# Patient Record
Sex: Male | Born: 1965 | Race: Black or African American | Hispanic: No | Smoking: Never smoker
Health system: Southern US, Community
[De-identification: ages and names within clinical notes are randomized; demographics above are authoritative.]

## PROBLEM LIST (undated history)

## (undated) DIAGNOSIS — N529 Male erectile dysfunction, unspecified: Secondary | ICD-10-CM

## (undated) DIAGNOSIS — T7840XA Allergy, unspecified, initial encounter: Secondary | ICD-10-CM

## (undated) DIAGNOSIS — E119 Type 2 diabetes mellitus without complications: Secondary | ICD-10-CM

## (undated) HISTORY — DX: Male erectile dysfunction, unspecified: N52.9

## (undated) HISTORY — DX: Allergy, unspecified, initial encounter: T78.40XA

## (undated) HISTORY — DX: Type 2 diabetes mellitus without complications: E11.9

---

## 2016-07-09 ENCOUNTER — Encounter (HOSPITAL_COMMUNITY): Payer: Self-pay | Admitting: Emergency Medicine

## 2016-07-09 ENCOUNTER — Inpatient Hospital Stay (HOSPITAL_COMMUNITY)
Admission: EM | Admit: 2016-07-09 | Discharge: 2016-07-11 | DRG: 638 | Disposition: A | Discharge status: Home / Self Care | Payer: BLUE CROSS/BLUE SHIELD | Attending: Internal Medicine | Admitting: Internal Medicine

## 2016-07-09 DIAGNOSIS — E1165 Type 2 diabetes mellitus with hyperglycemia: Principal | ICD-10-CM | POA: Diagnosis present

## 2016-07-09 DIAGNOSIS — R739 Hyperglycemia, unspecified: Secondary | ICD-10-CM | POA: Diagnosis not present

## 2016-07-09 DIAGNOSIS — N179 Acute kidney failure, unspecified: Secondary | ICD-10-CM | POA: Diagnosis present

## 2016-07-09 DIAGNOSIS — E871 Hypo-osmolality and hyponatremia: Secondary | ICD-10-CM | POA: Diagnosis present

## 2016-07-09 DIAGNOSIS — N289 Disorder of kidney and ureter, unspecified: Secondary | ICD-10-CM

## 2016-07-09 DIAGNOSIS — E119 Type 2 diabetes mellitus without complications: Secondary | ICD-10-CM

## 2016-07-09 DIAGNOSIS — E860 Dehydration: Secondary | ICD-10-CM | POA: Diagnosis present

## 2016-07-09 LAB — CBC
HEMATOCRIT: 41.5 % (ref 39.0–52.0)
Hemoglobin: 13.6 g/dL (ref 13.0–17.0)
MCH: 29.4 pg (ref 26.0–34.0)
MCHC: 32.8 g/dL (ref 30.0–36.0)
MCV: 89.8 fL (ref 78.0–100.0)
Platelets: 286 10*3/uL (ref 150–400)
RBC: 4.62 MIL/uL (ref 4.22–5.81)
RDW: 12.8 % (ref 11.5–15.5)
WBC: 6.9 10*3/uL (ref 4.0–10.5)

## 2016-07-09 LAB — URINALYSIS, ROUTINE W REFLEX MICROSCOPIC
BILIRUBIN URINE: NEGATIVE
HGB URINE DIPSTICK: NEGATIVE
KETONES UR: NEGATIVE mg/dL
Leukocytes, UA: NEGATIVE
Nitrite: NEGATIVE
PH: 6.5 (ref 5.0–8.0)
Protein, ur: NEGATIVE mg/dL
Specific Gravity, Urine: 1.03 (ref 1.005–1.030)

## 2016-07-09 LAB — BASIC METABOLIC PANEL
Anion gap: 9 (ref 5–15)
BUN: 15 mg/dL (ref 6–20)
CHLORIDE: 92 mmol/L — AB (ref 101–111)
CO2: 23 mmol/L (ref 22–32)
Calcium: 9.2 mg/dL (ref 8.9–10.3)
Creatinine, Ser: 1.27 mg/dL — ABNORMAL HIGH (ref 0.61–1.24)
GFR calc Af Amer: >60 mL/min (ref >60)
GFR calc non Af Amer: >60 mL/min (ref >60)
GLUCOSE: 866 mg/dL — AB (ref 65–99)
POTASSIUM: 4.3 mmol/L (ref 3.5–5.1)
Sodium: 124 mmol/L — ABNORMAL LOW (ref 135–145)

## 2016-07-09 LAB — URINE MICROSCOPIC-ADD ON: RBC / HPF: NONE SEEN RBC/hpf (ref 0–5)

## 2016-07-09 LAB — CBG MONITORING, ED: Glucose-Capillary: >600 mg/dL (ref 65–99)

## 2016-07-09 MED ORDER — SODIUM CHLORIDE 0.9 % IV BOLUS (SEPSIS)
2000.0000 mL | Freq: Once | INTRAVENOUS | Status: AC
  Administered 2016-07-10: 2000 mL via INTRAVENOUS

## 2016-07-09 NOTE — ED Provider Notes (Signed)
CSN: [id number]     Arrival date & time 07/09/16  2236 History  By signing my name below, I, Rosario Adie, attest that this documentation has been prepared under the direction and in the presence of Melene Plan, DO. Electronically Signed: Rosario Adie, ED Scribe. 07/09/2016. 12:10 AM.   Chief Complaint  Patient presents with  . Hyperglycemia   Patient is a 50 y.o. male presenting with hyperglycemia. The history is provided by the patient. No language interpreter was used.  Hyperglycemia Severity:  Severe Onset quality:  Gradual Timing:  Constant Progression:  Worsening Chronicity:  New Diabetes status:  Non-diabetic Relieved by:  Nothing Ineffective treatments:  None tried Associated symptoms: dizziness, increased thirst and polyuria   Associated symptoms: no abdominal pain, no chest pain, no confusion, no fever, no shortness of breath and no vomiting   Risk factors: no family hx of diabetes    HPI Comments: Jacob Smith. is a 50 y.o. male with no pertinent PMHx, who presents to the Emergency Department complaining of gradual onset, gradually worsening, intermittent episodes of dizziness, polyuria, and polydipsia onset PTA. Associated symptoms include diffuse myalgias. Pts family member reports that they took the pt's blood sugar level at home just PTA and it read "high". Pt does not have a hx of dx'd DM. Pt reports that he has been eating at baseline since the onset of his symptoms, but has been drinking fluids significantly more. He does not have a PCP. Pt denies CP, abdominal pain, SOB, or any other symptoms.  History reviewed. No pertinent past medical history. History reviewed. No pertinent surgical history. No family history on file. Social History  Substance Use Topics  . Smoking status: Never Smoker  . Smokeless tobacco: Not on file  . Alcohol use No    Review of Systems  Constitutional: Negative for fever and chills.  HENT: Negative for congestion and  facial swelling.   Eyes: Negative for discharge and visual disturbance.  Respiratory: Negative for shortness of breath.   Cardiovascular: Negative for chest pain and palpitations.  Gastrointestinal: Negative for vomiting, abdominal pain and diarrhea.  Endocrine: Positive for polydipsia and polyuria. Negative for polyphagia.  Musculoskeletal: Positive for myalgias (diffuse). Negative for arthralgias.  Skin: Negative for color change and rash.  Neurological: Positive for dizziness. Negative for tremors, syncope and headaches.  Psychiatric/Behavioral: Negative for confusion and dysphoric mood.  All other systems reviewed and are negative.  Allergies  Review of patient's allergies indicates no known allergies.  Home Medications   Prior to Admission medications   Not on File   BP 121/87 (BP Location: Right Arm)   Pulse 82   Temp 97.6 F (36.4 C) (Oral)   Resp 26   SpO2 94%    Physical Exam  Constitutional: He is oriented to person, place, and time. He appears well-developed and well-nourished.  HENT:  Head: Normocephalic and atraumatic.  Eyes: EOM are normal. Pupils are equal, round, and reactive to light.  Neck: Normal range of motion. Neck supple. No JVD present.  Cardiovascular: Normal rate and regular rhythm.  Exam reveals no gallop and no friction rub.   No murmur heard. Pulmonary/Chest: No respiratory distress. He has no wheezes.  Abdominal: He exhibits no distension. There is no rebound and no guarding.  Musculoskeletal: Normal range of motion.  Neurological: He is alert and oriented to person, place, and time.  Skin: No rash noted. No pallor.  Psychiatric: He has a normal mood and affect. His behavior is  normal.  Nursing note and vitals reviewed.  ED Course  Procedures (including critical care time)  DIAGNOSTIC STUDIES: Oxygen Saturation is 96% on RA, normal by my interpretation.   COORDINATION OF CARE: 12:09 AM-Discussed next steps with pt. Pt verbalized  understanding and is agreeable with the plan.   Labs Review Labs Reviewed  BASIC METABOLIC PANEL - Abnormal; Notable for the following:       Result Value   Sodium 124 (*)    Chloride 92 (*)    Glucose, Bld 866 (*)    Creatinine, Ser 1.27 (*)    All other components within normal limits  URINALYSIS, ROUTINE W REFLEX MICROSCOPIC (NOT AT [facility]) - Abnormal; Notable for the following:    Glucose, UA >1000 (*)    All other components within normal limits  URINE MICROSCOPIC-ADD ON - Abnormal; Notable for the following:    Squamous Epithelial / LPF 0-5 (*)    Bacteria, UA RARE (*)    All other components within normal limits  CBG MONITORING, ED - Abnormal; Notable for the following:    Glucose-Capillary >600 (*)    All other components within normal limits  CBG MONITORING, ED - Abnormal; Notable for the following:    Glucose-Capillary 599 (*)    All other components within normal limits  CBG MONITORING, ED - Abnormal; Notable for the following:    Glucose-Capillary 448 (*)    All other components within normal limits  CBC   Imaging Review No results found.  I have personally reviewed and evaluated these images and lab results as part of my medical decision-making.   EKG Interpretation None       MDM   Final diagnoses:  Hyperglycemia    50 yo Male with a chief complaint of hyperglycemia. This is noted this morning on a fingerstick at home. Patient has no history of diabetes he had been having increased urine frequency and polydipsia. On my exam patient is well-appearing and nontoxic. Labs are remarkable for a glucose of 860. Patient has no noted acidosis or elevated anion gap. Patient started on insulin drip. Admit to stepdown.  CRITICAL CARE Performed by: Rae Roam   Total critical care time: 35 minutes  Critical care time was exclusive of separately billable procedures and treating other patients.  Critical care was necessary to treat or prevent imminent or  life-threatening deterioration.  Critical care was time spent personally by me on the following activities: development of treatment plan with patient and/or surrogate as well as nursing, discussions with consultants, evaluation of patient's response to treatment, examination of patient, obtaining history from patient or surrogate, ordering and performing treatments and interventions, ordering and review of laboratory studies, ordering and review of radiographic studies, pulse oximetry and re-evaluation of patient's condition.   The patients results and plan were reviewed and discussed.   Any x-rays performed were independently reviewed by myself.   Differential diagnosis were considered with the presenting HPI.  Medications  dextrose 5 %-0.45 % sodium chloride infusion (not administered)  insulin regular (NOVOLIN R,HUMULIN R) 250 Units in sodium chloride 0.9 % 250 mL (1 Units/mL) infusion (not administered)  0.9 %  sodium chloride infusion (not administered)  sodium chloride 0.9 % bolus 2,000 mL (2,000 mLs Intravenous New Bag/Given 07/10/16 0010)    Vitals:   07/09/16 2246 07/10/16 0003 07/10/16 0004 07/10/16 0005  BP: 117/86 121/87 121/87   Pulse: 74  76 82  Resp: 18  26   Temp: 97.6 F (36.4  C)     TempSrc: Oral     SpO2: 96%  95% 94%    Final diagnoses:  Hyperglycemia    Admission/ observation were discussed with the admitting physician, patient and/or family and they are comfortable with the plan.   .I personally performed the services described in this documentation, which was scribed in my presence. The recorded information has been reviewed and is accurate.    Melene Plan, DO 07/10/16 6045

## 2016-07-09 NOTE — ED Notes (Signed)
Per pt, c/o dizzy spells, drinking water excessively, using the bathroom a lot. Pt family member took blood sugar and the reading showed "high". Pt no history of diabetes. Pt ambulatory with steady gait. Pt c/o soreness all over.

## 2016-07-10 ENCOUNTER — Encounter (HOSPITAL_COMMUNITY): Payer: Self-pay | Admitting: Family Medicine

## 2016-07-10 DIAGNOSIS — E119 Type 2 diabetes mellitus without complications: Secondary | ICD-10-CM

## 2016-07-10 DIAGNOSIS — E1165 Type 2 diabetes mellitus with hyperglycemia: Secondary | ICD-10-CM | POA: Diagnosis present

## 2016-07-10 DIAGNOSIS — E871 Hypo-osmolality and hyponatremia: Secondary | ICD-10-CM | POA: Diagnosis not present

## 2016-07-10 DIAGNOSIS — N289 Disorder of kidney and ureter, unspecified: Secondary | ICD-10-CM

## 2016-07-10 DIAGNOSIS — N179 Acute kidney failure, unspecified: Secondary | ICD-10-CM | POA: Diagnosis present

## 2016-07-10 DIAGNOSIS — E860 Dehydration: Secondary | ICD-10-CM | POA: Diagnosis present

## 2016-07-10 DIAGNOSIS — R739 Hyperglycemia, unspecified: Secondary | ICD-10-CM

## 2016-07-10 LAB — GLUCOSE, CAPILLARY
GLUCOSE-CAPILLARY: 148 mg/dL — AB (ref 65–99)
GLUCOSE-CAPILLARY: 141 mg/dL — AB (ref 65–99)
GLUCOSE-CAPILLARY: 158 mg/dL — AB (ref 65–99)
GLUCOSE-CAPILLARY: 173 mg/dL — AB (ref 65–99)
Glucose-Capillary: 328 mg/dL — ABNORMAL HIGH (ref 65–99)
Glucose-Capillary: 186 mg/dL — ABNORMAL HIGH (ref 65–99)
Glucose-Capillary: 145 mg/dL — ABNORMAL HIGH (ref 65–99)
Glucose-Capillary: 161 mg/dL — ABNORMAL HIGH (ref 65–99)
Glucose-Capillary: 159 mg/dL — ABNORMAL HIGH (ref 65–99)
Glucose-Capillary: 176 mg/dL — ABNORMAL HIGH (ref 65–99)
Glucose-Capillary: 293 mg/dL — ABNORMAL HIGH (ref 65–99)

## 2016-07-10 LAB — BASIC METABOLIC PANEL
ANION GAP: 4 — AB (ref 5–15)
Anion gap: 7 (ref 5–15)
Anion gap: 7 (ref 5–15)
BUN: 10 mg/dL (ref 6–20)
BUN: 10 mg/dL (ref 6–20)
BUN: 9 mg/dL (ref 6–20)
CHLORIDE: 104 mmol/L (ref 101–111)
CHLORIDE: 106 mmol/L (ref 101–111)
CHLORIDE: 107 mmol/L (ref 101–111)
CO2: 26 mmol/L (ref 22–32)
CO2: 26 mmol/L (ref 22–32)
CO2: 26 mmol/L (ref 22–32)
CREATININE: 1.04 mg/dL (ref 0.61–1.24)
CREATININE: 1 mg/dL (ref 0.61–1.24)
CREATININE: 0.99 mg/dL (ref 0.61–1.24)
Calcium: 9.1 mg/dL (ref 8.9–10.3)
Calcium: 9.2 mg/dL (ref 8.9–10.3)
Calcium: 8.8 mg/dL — ABNORMAL LOW (ref 8.9–10.3)
GFR calc non Af Amer: >60 mL/min (ref >60)
GFR calc non Af Amer: >60 mL/min (ref >60)
GFR calc non Af Amer: >60 mL/min (ref >60)
Glucose, Bld: 325 mg/dL — ABNORMAL HIGH (ref 65–99)
Glucose, Bld: 153 mg/dL — ABNORMAL HIGH (ref 65–99)
Glucose, Bld: 169 mg/dL — ABNORMAL HIGH (ref 65–99)
Potassium: 3.5 mmol/L (ref 3.5–5.1)
Potassium: 3.8 mmol/L (ref 3.5–5.1)
Potassium: 3.7 mmol/L (ref 3.5–5.1)
Sodium: 137 mmol/L (ref 135–145)
Sodium: 139 mmol/L (ref 135–145)
Sodium: 137 mmol/L (ref 135–145)

## 2016-07-10 LAB — LIPID PANEL
CHOL/HDL RATIO: 7.7 ratio
Cholesterol: 169 mg/dL (ref 0–200)
HDL: 22 mg/dL — AB (ref >40)
LDL CALC: UNDETERMINED mg/dL (ref 0–99)
TRIGLYCERIDES: 528 mg/dL — AB (ref <150)
VLDL: UNDETERMINED mg/dL (ref 0–40)

## 2016-07-10 LAB — CBG MONITORING, ED
GLUCOSE-CAPILLARY: 599 mg/dL — AB (ref 65–99)
Glucose-Capillary: 448 mg/dL — ABNORMAL HIGH (ref 65–99)

## 2016-07-10 LAB — MRSA PCR SCREENING: MRSA by PCR: NEGATIVE

## 2016-07-10 MED ORDER — INSULIN STARTER KIT- SYRINGES (ENGLISH)
1.0000 | Freq: Once | Status: DC
  Filled 2016-07-10: qty 1

## 2016-07-10 MED ORDER — ENOXAPARIN SODIUM 40 MG/0.4ML ~~LOC~~ SOLN
40.0000 mg | SUBCUTANEOUS | Status: DC
  Administered 2016-07-10 – 2016-07-11 (×2): 40 mg via SUBCUTANEOUS
  Filled 2016-07-10 (×2): qty 0.4

## 2016-07-10 MED ORDER — SODIUM CHLORIDE 0.9 % IV SOLN: INTRAVENOUS | Status: DC

## 2016-07-10 MED ORDER — INSULIN GLARGINE 100 UNIT/ML ~~LOC~~ SOLN
18.0000 [IU] | Freq: Every day | SUBCUTANEOUS | Status: DC
  Administered 2016-07-10 – 2016-07-11 (×2): 18 [IU] via SUBCUTANEOUS
  Filled 2016-07-10 (×2): qty 0.18

## 2016-07-10 MED ORDER — POTASSIUM CHLORIDE 10 MEQ/100ML IV SOLN
10.0000 meq | INTRAVENOUS | Status: DC
  Administered 2016-07-10: 10 meq via INTRAVENOUS
  Filled 2016-07-10: qty 100

## 2016-07-10 MED ORDER — LIVING WELL WITH DIABETES BOOK
Freq: Once | Status: DC
  Filled 2016-07-10: qty 1

## 2016-07-10 MED ORDER — INSULIN ASPART 100 UNIT/ML ~~LOC~~ SOLN
0.0000 [IU] | Freq: Three times a day (TID) | SUBCUTANEOUS | Status: DC
  Administered 2016-07-10 – 2016-07-11 (×2): 3 [IU] via SUBCUTANEOUS
  Administered 2016-07-11: 5 [IU] via SUBCUTANEOUS

## 2016-07-10 MED ORDER — SODIUM CHLORIDE 0.9 % IV SOLN
INTRAVENOUS | Status: DC
  Filled 2016-07-10: qty 2.5

## 2016-07-10 MED ORDER — ASPIRIN EC 81 MG PO TBEC: 81.0000 mg | DELAYED_RELEASE_TABLET | Freq: Every day | ORAL | Status: DC

## 2016-07-10 MED ORDER — SODIUM CHLORIDE 0.9 % IV SOLN
INTRAVENOUS | Status: DC
  Administered 2016-07-10: 16:00:00 via INTRAVENOUS
  Administered 2016-07-11: 1000 mL via INTRAVENOUS

## 2016-07-10 MED ORDER — ASPIRIN EC 81 MG PO TBEC
81.0000 mg | DELAYED_RELEASE_TABLET | Freq: Every day | ORAL | Status: DC
  Administered 2016-07-11: 81 mg via ORAL
  Filled 2016-07-10: qty 1

## 2016-07-10 MED ORDER — INSULIN ASPART 100 UNIT/ML ~~LOC~~ SOLN
0.0000 [IU] | Freq: Every day | SUBCUTANEOUS | Status: DC
  Administered 2016-07-10: 3 [IU] via SUBCUTANEOUS

## 2016-07-10 MED ORDER — DEXTROSE-NACL 5-0.45 % IV SOLN
INTRAVENOUS | Status: DC
  Administered 2016-07-10: 09:00:00 via INTRAVENOUS

## 2016-07-10 MED ORDER — DEXTROSE-NACL 5-0.45 % IV SOLN: INTRAVENOUS | Status: DC

## 2016-07-10 NOTE — Progress Notes (Addendum)
Inpatient Diabetes Program Recommendations  AACE/ADA: New Consensus Statement on Inpatient Glycemic Control (2015)  Target Ranges:  Prepandial:   less than 140 mg/dL      Peak postprandial:   less than 180 mg/dL (1-2 hours)      Critically ill patients:  140 - 180 mg/dL   Results for VANN, MENDILLO (MRN [id number]) as of 07/10/2016 15:17  Ref. Range 07/09/2016 22:45 07/10/2016 01:44 07/10/2016 03:29 07/10/2016 06:35 07/10/2016 12:05  Glucose-Capillary Latest Ref Range: 65 - 99 mg/dL >191 (HH) 478 (HH) 295 (H) 328 (H) 145 (H)    Admit with: HONK, New Onset DM   History: None  Current Insulin Orders: IV Insulin Drip (to transition to Lantus and Novolog today per MD orders)    -Will order DM educational materials for this patient.  RN to initiate basic DM survival skills education with patient asap.  -Did not see that patient has PCP nor health insurance.  Will consult Care Management with assistance with this issue.  -Note A1c, GAD antibody, and Insulin antibody levels pending.  -Will have DM Coordinator follow up with patient tomorrow.    MD- Patient may not be able to afford Lantus and Novolog at time of discharge.  Please consider switching patient to 70/30 insulin at some point during hospitalization for affordability at time of d/c.  Patient can purchase Reli-On 70/30 insulin at [facility] for $25 per vial with physician Rx.      --Will follow patient during hospitalization--  Ambrose Finland RN, MSN, CDE Diabetes Coordinator Inpatient Glycemic Control Team Team Pager: [phone] (8a-5p)

## 2016-07-10 NOTE — Progress Notes (Signed)
Patient doing well, good appetite. Vital signs are stable. Insulin infusion completed at 1700. Education literature has been ordered for patient. Up to the bathroom with stand by assist. No questions or concerns at this time.

## 2016-07-10 NOTE — H&P (Signed)
History and Physical    Jacob Smith. MVE:[id number] DOB: July 04, 1966 DOA: 07/09/2016  PCP: No primary care provider on file.   Jacob Smith coming from: Home   Chief Complaint: Polyuria, polydipsia, malaise   HPI: Jacob Schuth. is a 50 y.o. male who describes himself as generally healthy and takes no medications on a regular basis, presenting to the emergency department for evaluation of malaise, polyuria, and polydipsia for approximately one month. Jacob Smith reports pain in his usual state of good health until approximately one month ago when he noted the insidious development of dry mouth, polyuria, and polydipsia. These symptoms continued to worsen over the ensuing interval and became accompanied by a generalized malaise. He denies any fevers, chills, chest pain, palpitations, dyspnea, or cough associated with this. There has been no abdominal pain, nausea, vomiting, or diarrhea. He denies any headaches, loss of coordination, focal numbness or weakness, or vision or hearing change. He endorses a family history of non-insulin-dependent diabetes mellitus in his father, brother, and sister. He denies any history significant medical illness. A family member assessed the Jacob Smith's complaints by checking his CBG, which reportedly read "high."  ED Course: Upon arrival to the ED, Jacob Smith is found to be afebrile, saturating adequately on room air, and with vital signs stable. BMP is notable for sodium 124, chloride 92, serum creatinine 1.27, and serum glucose of 866. Serum bicarbonate is normal, as is anion gap. CBC is unremarkable and urinalysis is notable for greater than 1000 glucose, but no ketones. Jacob Smith was given a 2 L normal saline bolus in the emergency department and has remained hemodynamically stable. He will be placed on insulin infusion for initial glycemic-control and observed in the stepdown unit for ongoing evaluation and management of new onset diabetes mellitus with serum glucose greater  than 800, significant symptomatology, and lack of outpatient follow-up.    Review of Systems:  All other systems reviewed and apart from HPI, are negative.  History reviewed. No pertinent past medical history.  History reviewed. No pertinent surgical history.   reports that he has never smoked. He does not have any smokeless tobacco history on file. He reports that he does not drink alcohol. His drug history is not on file.  No Known Allergies  History reviewed. No pertinent family history.   Prior to Admission medications   Not on File    Physical Exam: Vitals:   07/09/16 2246 07/10/16 0003 07/10/16 0004 07/10/16 0005  BP: 117/86 121/87 121/87   Pulse: 74  76 82  Resp: 18  26   Temp: 97.6 F (36.4 C)     TempSrc: Oral     SpO2: 96%  95% 94%      Constitutional: NAD, calm, comfortable Eyes: PERTLA, lids and conjunctivae normal ENMT: Mucous membranes are dry. Posterior pharynx clear of any exudate or lesions.   Neck: normal, supple, no masses, no thyromegaly Respiratory: clear to auscultation bilaterally, no wheezing, no crackles. Normal respiratory effort.   Cardiovascular: S1 & S2 heard, regular rate and rhythm, no significant murmurs, hyperdynamic precordium. No extremity edema. 2+ pedal pulses. No carotid bruits.   Abdomen: No distension, no tenderness, no masses palpated. Bowel sounds normal.  Musculoskeletal: no clubbing / cyanosis. No joint deformity upper and lower extremities. Normal muscle tone.  Skin: no significant rashes, lesions, ulcers. Warm, dry, well-perfused. Neurologic: CN 2-12 grossly intact. Sensation intact, DTR normal. Strength 5/5 in all 4 limbs.  Psychiatric: Normal judgment and insight. Alert and oriented x 3. Normal mood  and affect.     Labs on Admission: I have personally reviewed following labs and imaging studies  CBC:  Recent Labs Lab 07/09/16 2251  WBC 6.9  HGB 13.6  HCT 41.5  MCV 89.8  PLT 286   Basic Metabolic  Panel:  Recent Labs Lab 07/09/16 2251  NA 124*  K 4.3  CL 92*  CO2 23  GLUCOSE 866*  BUN 15  CREATININE 1.27*  CALCIUM 9.2   GFR: CrCl cannot be calculated (Unknown ideal weight.). Liver Function Tests: No results for input(s): AST, ALT, ALKPHOS, BILITOT, PROT, ALBUMIN in the last 168 hours. No results for input(s): LIPASE, AMYLASE in the last 168 hours. No results for input(s): AMMONIA in the last 168 hours. Coagulation Profile: No results for input(s): INR, PROTIME in the last 168 hours. Cardiac Enzymes: No results for input(s): CKTOTAL, CKMB, CKMBINDEX, TROPONINI in the last 168 hours. BNP (last 3 results) No results for input(s): PROBNP in the last 8760 hours. HbA1C: No results for input(s): HGBA1C in the last 72 hours. CBG:  Recent Labs Lab 07/09/16 2245 07/10/16 0144 07/10/16 0329  GLUCAP >600* 599* 448*   Lipid Profile: No results for input(s): CHOL, HDL, LDLCALC, TRIG, CHOLHDL, LDLDIRECT in the last 72 hours. Thyroid Function Tests: No results for input(s): TSH, T4TOTAL, FREET4, T3FREE, THYROIDAB in the last 72 hours. Anemia Panel: No results for input(s): VITAMINB12, FOLATE, FERRITIN, TIBC, IRON, RETICCTPCT in the last 72 hours. Urine analysis:    Component Value Date/Time   COLORURINE YELLOW 07/09/2016 2305   APPEARANCEUR CLEAR 07/09/2016 2305   LABSPEC 1.030 07/09/2016 2305   PHURINE 6.5 07/09/2016 2305   GLUCOSEU >1000 (A) 07/09/2016 2305   HGBUR NEGATIVE 07/09/2016 2305   BILIRUBINUR NEGATIVE 07/09/2016 2305   KETONESUR NEGATIVE 07/09/2016 2305   PROTEINUR NEGATIVE 07/09/2016 2305   NITRITE NEGATIVE 07/09/2016 2305   LEUKOCYTESUR NEGATIVE 07/09/2016 2305   Sepsis Labs: (procalcitonin:4,lacticidven:4) )No results found for this or any previous visit (from the past 240 hour(s)).   Radiological Exams on Admission: No results found.  EKG: Not performed, will obtain as appropriate   Assessment/Plan  1. New-onset diabetes  mellitus - Likely type II given strong FHx, age, and obesity  - Given degree of hyperglycemia (>800), will start with insulin infusion for initial glycemic-control  - Check antibodies, screen for HLD  - Diabetes educator requested   2. Kidney disease of uncertain chronicity  - SCr 1.27 on admission with no prior for comparison  - Suspect at least some of this to be acute prerenal azotemia in setting of uncontrolled sugars and intravascular volume depletion  - Avoid nephrotoxins; continue fluid-resuscitation  - Repeat chem panel   3. Hyponatremia  - Serum sodium 124 on admission, in setting of dehydration  - Suspect some of this to be factitious and related to the very high blood sugar  - Anticipate normalization with correction of sugars and volume deficit    DVT prophylaxis: sq Lovenox  Code Status: Full  Family Communication: Spouse updated at bedside Disposition Plan: Observe in stepdown   Consults called: None Admission status: Observation    Briscoe Deutscher, MD [facility] Pager [phone]  If 7PM-7AM, please contact night-coverage [email or web address] Password [facility]  07/10/2016, 6:07 AM

## 2016-07-10 NOTE — Progress Notes (Signed)
[place] TEAM 1 - Stepdown/ICU TEAM  Janae Bridgeman.  TKW:[id number] DOB: 10/16/1966 DOA: 07/09/2016 PCP: No primary care provider on file.    Brief Narrative:  50 y.o. male who takes no medications on a regular basis, presented to the emergency department for evaluation of malaise, polyuria, and polydipsia for one month.  He endorsed a family history of non-insulin-dependent diabetes mellitus in his father, brother, and sister.  In the ED BMP was notable for sodium 124, serum creatinine 1.27, and serum glucose of 866. Serum bicarbonate was normal, as was anion gap. Urinalysis was notable for greater than 1000 glucose, but no ketones.   Subjective: Pt is seen for a f/u visit.    Assessment & Plan:  New onset diabetes mellitus - HONK  Renal injury of uncertain chronicity  Hyponatremia + pseudohyponatremia  DVT prophylaxis: Lovenox Code Status: FULL CODE Family Communication: no family present at time of exam  Disposition Plan:   Consultants:  None  Procedures: None  Antimicrobials:  None  Objective: Blood pressure (!) 81/55, pulse 60, temperature 98.2 F (36.8 C), temperature source Oral, resp. rate 15, height 6\' 4"  (1.93 m), weight 105.6 kg (232 lb 12.9 oz), SpO2 98 %.  Intake/Output Summary (Last 24 hours) at 07/10/16 1445 Last data filed at 07/10/16 0700  Gross per 24 hour  Intake                0 ml  Output                1 ml  Net               -1 ml   Filed Weights   07/10/16 0500  Weight: 105.6 kg (232 lb 12.9 oz)    Examination: Pt was seen for a f/u visit.    CBC:  Recent Labs Lab 07/09/16 2251  WBC 6.9  HGB 13.6  HCT 41.5  MCV 89.8  PLT 286   Basic Metabolic Panel:  Recent Labs Lab 07/09/16 2251 07/10/16 0622 07/10/16 1005 07/10/16 1354  NA 124* 137 139 137  K 4.3 3.5 3.8 3.7  CL 92* 104 106 107  CO2 23 26 26 26   GLUCOSE 866* 325* 153* 169*  BUN 15 10 10 9   CREATININE 1.27* 1.04 1.00 0.99  CALCIUM 9.2 9.1 9.2 8.8*    GFR: Estimated Creatinine Clearance: 120.4 mL/min (by C-G formula based on SCr of 0.99 mg/dL).  Liver Function Tests: No results for input(s): AST, ALT, ALKPHOS, BILITOT, PROT, ALBUMIN in the last 168 hours. No results for input(s): LIPASE, AMYLASE in the last 168 hours. No results for input(s): AMMONIA in the last 168 hours.  CBG:  Recent Labs Lab 07/09/16 2245 07/10/16 0144 07/10/16 0329 07/10/16 0635 07/10/16 1205  GLUCAP >600* 599* 448* 328* 145*    Recent Results (from the past 240 hour(s))  MRSA PCR Screening     Status: None   Collection Time: 07/10/16  6:50 AM  Result Value Ref Range Status   MRSA by PCR NEGATIVE NEGATIVE Final    Comment:        The GeneXpert MRSA Assay (FDA approved for NASAL specimens only), is one component of a comprehensive MRSA colonization surveillance program. It is not intended to diagnose MRSA infection nor to guide or monitor treatment for MRSA infections.      Scheduled Meds: . enoxaparin (LOVENOX) injection  40 mg Subcutaneous Q24H   Continuous Infusions: . sodium chloride Stopped (07/10/16 0919)  .  dextrose 5 % and 0.45% NaCl 100 mL/hr at 07/10/16 1300  . insulin (NOVOLIN-R) infusion 2.9 Units/hr (07/10/16 1300)     LOS: 0 days   Time spent: No Charge  Lonia Blood, MD [facility] Office  [phone] Pager - Text Page per Amion as per below:  On-Call/Text Page:      [email or web address]      password [email or web address]  If 7PM-7AM, please contact night-coverage [email or web address] Password [email or web address] 07/10/2016, 2:45 PM

## 2016-07-11 LAB — GLUCOSE, CAPILLARY
GLUCOSE-CAPILLARY: 239 mg/dL — AB (ref 65–99)
Glucose-Capillary: 188 mg/dL — ABNORMAL HIGH (ref 65–99)

## 2016-07-11 LAB — COMPREHENSIVE METABOLIC PANEL
ALBUMIN: 3.4 g/dL — AB (ref 3.5–5.0)
ALT: 61 U/L (ref 17–63)
ANION GAP: 5 (ref 5–15)
AST: 51 U/L — ABNORMAL HIGH (ref 15–41)
Alkaline Phosphatase: 81 U/L (ref 38–126)
BUN: 8 mg/dL (ref 6–20)
CO2: 27 mmol/L (ref 22–32)
Calcium: 8.8 mg/dL — ABNORMAL LOW (ref 8.9–10.3)
Chloride: 106 mmol/L (ref 101–111)
Creatinine, Ser: 1.13 mg/dL (ref 0.61–1.24)
GFR calc Af Amer: >60 mL/min (ref >60)
GFR calc non Af Amer: >60 mL/min (ref >60)
GLUCOSE: 270 mg/dL — AB (ref 65–99)
POTASSIUM: 3.9 mmol/L (ref 3.5–5.1)
SODIUM: 138 mmol/L (ref 135–145)
Total Bilirubin: 0.7 mg/dL (ref 0.3–1.2)
Total Protein: 6.2 g/dL — ABNORMAL LOW (ref 6.5–8.1)

## 2016-07-11 LAB — CBC
HEMATOCRIT: 38.5 % — AB (ref 39.0–52.0)
HEMOGLOBIN: 12.4 g/dL — AB (ref 13.0–17.0)
MCH: 28.4 pg (ref 26.0–34.0)
MCHC: 32.2 g/dL (ref 30.0–36.0)
MCV: 88.1 fL (ref 78.0–100.0)
Platelets: 276 10*3/uL (ref 150–400)
RBC: 4.37 MIL/uL (ref 4.22–5.81)
RDW: 12.9 % (ref 11.5–15.5)
WBC: 8.2 10*3/uL (ref 4.0–10.5)

## 2016-07-11 LAB — GLUTAMIC ACID DECARBOXYLASE AUTO ABS: Glutamic Acid Decarb Ab: <5 U/mL (ref 0.0–5.0)

## 2016-07-11 LAB — HEMOGLOBIN A1C
HEMOGLOBIN A1C: 11.2 % — AB (ref 4.8–5.6)
MEAN PLASMA GLUCOSE: 275 mg/dL

## 2016-07-11 LAB — TSH: TSH: 0.894 u[IU]/mL (ref 0.350–4.500)

## 2016-07-11 MED ORDER — INSULIN GLARGINE 100 UNIT/ML ~~LOC~~ SOLN: 24.0000 [IU] | Freq: Every day | SUBCUTANEOUS | 1 refills | Status: DC

## 2016-07-11 MED ORDER — INSULIN GLARGINE 100 UNIT/ML ~~LOC~~ SOLN: 24.0000 [IU] | Freq: Every day | SUBCUTANEOUS | Status: DC

## 2016-07-11 MED ORDER — BLOOD GLUCOSE METER KIT: PACK | 0 refills | Status: DC

## 2016-07-11 MED ORDER — INSULIN STARTER KIT- SYRINGES (ENGLISH): 1.0000 | Freq: Once | 0 refills | Status: AC

## 2016-07-11 MED ORDER — ASPIRIN 81 MG PO TBEC
81.0000 mg | DELAYED_RELEASE_TABLET | Freq: Every day | ORAL | Status: DC
Start: 2016-07-11 — End: 2023-06-21

## 2016-07-11 NOTE — Discharge Instructions (Signed)
Type 2 Diabetes Mellitus, Adult ° °Type 2 diabetes mellitus, often simply referred to as type 2 diabetes, is a long-lasting (chronic) disease. In type 2 diabetes, the pancreas does not make enough insulin (a hormone), the cells are less responsive to the insulin that is made (insulin resistance), or both. Normally, insulin moves sugars from food into the tissue cells. The tissue cells use the sugars for energy. The lack of insulin or the lack of normal response to insulin causes excess sugars to build up in the blood instead of going into the tissue cells. As a result, high blood sugar (hyperglycemia) develops. The effect of high sugar (glucose) levels can cause many complications.  °Type 2 diabetes was also previously called adult-onset diabetes, but it can occur at any age.    °RISK FACTORS  °A person is predisposed to developing type 2 diabetes if someone in the family has the disease and also has one or more of the following primary risk factors: °· Weight gain, or being overweight or obese. °· An inactive lifestyle. °· A history of consistently eating high-calorie foods. °Maintaining a normal weight and regular physical activity can reduce the chance of developing type 2 diabetes. °SYMPTOMS  °A person with type 2 diabetes may not show symptoms initially. The symptoms of type 2 diabetes appear slowly. The symptoms include: °· Increased thirst (polydipsia). °· Increased urination (polyuria). °· Increased urination during the night (nocturia). °· Sudden or unexplained weight changes. °· Frequent, recurring infections. °· Tiredness (fatigue). °· Weakness. °· Vision changes, such as blurred vision. °· Fruity smell to your breath. °· Abdominal pain. °· Nausea or vomiting. °· Cuts or bruises which are slow to heal. °· Tingling or numbness in the hands or feet. °· An open skin wound (ulcer). °DIAGNOSIS °Type 2 diabetes is frequently not diagnosed until complications of diabetes are present. Type 2 diabetes is diagnosed  when symptoms or complications are present and when blood glucose levels are increased. Your blood glucose level may be checked by one or more of the following blood tests: °· A fasting blood glucose test. You will not be allowed to eat for at least 8 hours before a blood sample is taken. °· A random blood glucose test. Your blood glucose is checked at any time of the day regardless of when you ate. °· A hemoglobin A1c blood glucose test. A hemoglobin A1c test provides information about blood glucose control over the previous 3 months. °· An oral glucose tolerance test (OGTT). Your blood glucose is measured after you have not eaten (fasted) for 2 hours and then after you drink a glucose-containing beverage. °TREATMENT  °· You may need to take insulin or diabetes medicine daily to keep blood glucose levels in the desired range. °· If you use insulin, you may need to adjust the dosage depending on the carbohydrates that you eat with each meal or snack. °· Lifestyle changes are recommended as part of your treatment. These may include: °¨ Following an individualized diet plan developed by a nutritionist or dietitian. °¨ Exercising daily. °Your health care providers will set individualized treatment goals for you based on your age, your medicines, how long you have had diabetes, and any other medical conditions you have. Generally, the goal of treatment is to maintain the following blood glucose levels: °· Before meals (preprandial): 80-130 mg/dL. °· After meals (postprandial): below 180 mg/dL. °· A1c: less than 6.5-7%. °HOME CARE INSTRUCTIONS  °· Have your hemoglobin A1c level checked twice a year. °· Perform daily blood glucose   monitoring as directed by your health care provider. °· Monitor urine ketones when you are ill and as directed by your health care provider. °· Take your diabetes medicine or insulin as directed by your health care provider to maintain your blood glucose levels in the desired range. °¨ Never run  out of diabetes medicine or insulin. It is needed every day. °¨ If you are using insulin, you may need to adjust the amount of insulin given based on your intake of carbohydrates. Carbohydrates can raise blood glucose levels but need to be included in your diet. Carbohydrates provide vitamins, minerals, and fiber which are an essential part of a healthy diet. Carbohydrates are found in fruits, vegetables, whole grains, dairy products, legumes, and foods containing added sugars. °· Eat healthy foods. You should make an appointment to see a registered dietitian to help you create an eating plan that is right for you. °· Lose weight if you are overweight. °· Carry a medical alert card or wear your medical alert jewelry. °· Carry a 15-gram carbohydrate snack with you at all times to treat low blood glucose (hypoglycemia). Some examples of 15-gram carbohydrate snacks include: °¨ Glucose tablets, 3 or 4. °¨ Glucose gel, 15-gram tube. °¨ Raisins, 2 tablespoons (24 grams). °¨ Jelly beans, 6. °¨ Animal crackers, 8. °¨ Regular pop, 4 ounces (120 mL). °¨ Gummy treats, 9. °· Recognize hypoglycemia. Hypoglycemia occurs with blood glucose levels of 70 mg/dL and below. The risk for hypoglycemia increases when fasting or skipping meals, during or after intense exercise, and during sleep. Hypoglycemia symptoms can include: °¨ Tremors or shakes. °¨ Decreased ability to concentrate. °¨ Sweating. °¨ Increased heart rate. °¨ Headache. °¨ Dry mouth. °¨ Hunger. °¨ Irritability. °¨ Anxiety. °¨ Restless sleep. °¨ Altered speech or coordination. °¨ Confusion. °· Treat hypoglycemia promptly. If you are alert and able to safely swallow, follow the 15:15 rule: °¨ Take 15-20 grams of rapid-acting glucose or carbohydrate. Rapid-acting options include glucose gel, glucose tablets, or 4 ounces (120 mL) of fruit juice, regular soda, or low-fat milk. °¨ Check your blood glucose level 15 minutes after taking the glucose. °¨ Take 15-20 grams more of  glucose if the repeat blood glucose level is still 70 mg/dL or below. °¨ Eat a meal or snack within 1 hour once blood glucose levels return to normal. °· Be alert to feeling very thirsty and urinating more frequently than usual, which are early signs of hyperglycemia. An early awareness of hyperglycemia allows for prompt treatment. Treat hyperglycemia as directed by your health care provider. °· Engage in at least 150 minutes of moderate-intensity physical activity a week, spread over at least 3 days of the week or as directed by your health care provider. In addition, you should engage in resistance exercise at least 2 times a week or as directed by your health care provider. Try to spend no more than 90 minutes at one time inactive. °· Adjust your medicine and food intake as needed if you start a new exercise or sport. °· Follow your sick-day plan anytime you are unable to eat or drink as usual. °· Do not use any tobacco products including cigarettes, chewing tobacco, or electronic cigarettes. If you need help quitting, ask your health care provider. °· Limit alcohol intake to no more than 1 drink per day for nonpregnant women and 2 drinks per day for men. You should drink alcohol only when you are also eating food. Talk with your health care provider whether alcohol is   safe for you. Tell your health care provider if you drink alcohol several times a week. °· Keep all follow-up visits as directed by your health care provider. This is important. °· Schedule an eye exam soon after the diagnosis of type 2 diabetes and then annually. °· Perform daily skin and foot care. Examine your skin and feet daily for cuts, bruises, redness, nail problems, bleeding, blisters, or sores. A foot exam by a health care provider should be done annually. °· Brush your teeth and gums at least twice a day and floss at least once a day. Follow up with your dentist regularly. °· Share your diabetes management plan with your workplace or  school. °· Keep your immunizations up to date. It is recommended that you receive a flu (influenza) vaccine every year. It is also recommended that you receive a pneumonia (pneumococcal) vaccine. If you are 65 years of age or older and have never received a pneumonia vaccine, this vaccine may be given as a series of two separate shots. Ask your health care provider which additional vaccines may be recommended. °· Learn to manage stress. °· Obtain ongoing diabetes education and support as needed. °· Participate in or seek rehabilitation as needed to maintain or improve independence and quality of life. Request a physical or occupational therapy referral if you are having foot or hand numbness, or difficulties with grooming, dressing, eating, or physical activity. °SEEK MEDICAL CARE IF:  °· You are unable to eat food or drink fluids for more than 6 hours. °· You have nausea and vomiting for more than 6 hours. °· Your blood glucose level is over 240 mg/dL. °· There is a change in mental status. °· You develop an additional serious illness. °· You have diarrhea for more than 6 hours. °· You have been sick or have had a fever for a couple of days and are not getting better. °· You have pain during any physical activity.   °SEEK IMMEDIATE MEDICAL CARE IF: °· You have difficulty breathing. °· You have moderate to large ketone levels. °  °This information is not intended to replace advice given to you by your health care provider. Make sure you discuss any questions you have with your health care provider. °  °Document Released: 12/05/2005 Document Revised: 08/26/2015 Document Reviewed: 07/03/2012 °Elsevier Interactive Patient Education ©2016 [employer] ° ° °Blood Glucose Monitoring, Adult ° °Monitoring your blood glucose (also know as blood sugar) helps you to manage your diabetes. It also helps you and your health care provider monitor your diabetes and determine how well your treatment plan is working. °WHY SHOULD YOU  MONITOR YOUR BLOOD GLUCOSE? °· It can help you understand how food, exercise, and medicine affect your blood glucose. °· It allows you to know what your blood glucose is at any given moment. You can quickly tell if you are having low blood glucose (hypoglycemia) or high blood glucose (hyperglycemia). °· It can help you and your health care provider know how to adjust your medicines. °· It can help you understand how to manage an illness or adjust medicine for exercise. °WHEN SHOULD YOU TEST? °Your health care provider will help you decide how often you should check your blood glucose. This may depend on the type of diabetes you have, your diabetes control, or the types of medicines you are taking. Be sure to write down all of your blood glucose readings so that this information can be reviewed with your health care provider. See below for examples   of testing times that your health care provider may suggest. °Type 1 Diabetes °· Test at least 2 times per day if your diabetes is well controlled, if you are using an insulin pump, or if you perform multiple daily injections. °· If your diabetes is not well controlled or if you are sick, you may need to test more often. °· It is a good idea to also test: °¨ Before every insulin injection. °¨ Before and after exercise. °¨ Between meals and 2 hours after a meal. °¨ Occasionally between 2:00 a.m. and 3:00 a.m. °Type 2 Diabetes °· If you are taking insulin, test at least 2 times per day. However, it is best to test before every insulin injection. °· If you take medicines by mouth (orally), test 2 times a day. °· If you are on a controlled diet, test once a day. °· If your diabetes is not well controlled or if you are sick, you may need to monitor more often. °HOW TO MONITOR YOUR BLOOD GLUCOSE °Supplies Needed °· Blood glucose meter. °· Test strips for your meter. Each meter has its own strips. You must use the strips that go with your own meter. °· A pricking needle  (lancet). °· A device that holds the lancet (lancing device). °· A journal or log book to write down your results. °Procedure °· Wash your hands with soap and water. Alcohol is not preferred. °· Prick the side of your finger (not the tip) with the lancet. °· Gently milk the finger until a small drop of blood appears. °· Follow the instructions that come with your meter for inserting the test strip, applying blood to the strip, and using your blood glucose meter. °Other Areas to Get Blood for Testing °Some meters allow you to use other areas of your body (other than your finger) to test your blood. These areas are called alternative sites. The most common alternative sites are: °· The forearm. °· The thigh. °· The back area of the lower leg. °· The palm of the hand. °The blood flow in these areas is slower. Therefore, the blood glucose values you get may be delayed, and the numbers are different from what you would get from your fingers. Do not use alternative sites if you think you are having hypoglycemia. Your reading will not be accurate. Always use a finger if you are having hypoglycemia. Also, if you cannot feel your lows (hypoglycemia unawareness), always use your fingers for your blood glucose checks. °ADDITIONAL TIPS FOR GLUCOSE MONITORING °· Do not reuse lancets. °· Always carry your supplies with you. °· All blood glucose meters have a 24-hour "hotline" number to call if you have questions or need help. °· Adjust (calibrate) your blood glucose meter with a control solution after finishing a few boxes of strips. °BLOOD GLUCOSE RECORD KEEPING °It is a good idea to keep a daily record or log of your blood glucose readings. Most glucose meters, if not all, keep your glucose records stored in the meter. Some meters come with the ability to download your records to your home computer. Keeping a record of your blood glucose readings is especially helpful if you are wanting to look for patterns. Make notes to go  along with the blood glucose readings because you might forget what happened at that exact time. Keeping good records helps you and your health care provider to work together to achieve good diabetes management.  °  °This information is not intended to replace advice given to   you by your health care provider. Make sure you discuss any questions you have with your health care provider. °  °Document Released: 12/08/2003 Document Revised: 12/26/2014 Document Reviewed: 04/29/2013 °Elsevier Interactive Patient Education ©2016 [employer] Inc. ° °

## 2016-07-11 NOTE — Plan of Care (Signed)
Problem: Food- and Nutrition-Related Knowledge Deficit (NB-1.1) Goal: Nutrition education Formal process to instruct or train a patient/client in a skill or to impart knowledge to help patients/clients voluntarily manage or modify food choices and eating behavior to maintain or improve health. Outcome: Adequate for Discharge  RD consulted for nutrition education regarding diabetes.   Lab Results  Component Value Date   HGBA1C 11.2 (H) 07/10/2016   Case discussed with RN and DM coordinator. Pt will likely discharge home today on insulin. Confirmed that pt does have insurance and PCP.   Spoke primarily with pt's fiance at bedside (pt watching DM education videos). She is very knowledgeable about DM and has successfully managed her own DM by lifestyle interventions. 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 Education session mainly focused on low calorie beverages available to pt.   RD provided "Carbohydrate Counting for People with Diabetes" handout from the Academy of [employer]etics. Discussed different food groups and their effects on blood sugar, emphasizing carbohydrate-containing foods. Provided list of carbohydrates and recommended serving sizes of common foods.  Discussed importance of controlled and consistent carbohydrate intake throughout the day. Provided examples of ways to balance meals/snacks and encouraged intake of high-fiber, whole grain complex carbohydrates. Teach back method used.  Expect fair to good compliance.  Body mass index is 28.34 kg/m. Pt meets criteria for overweight based on current BMI.  Current diet order is carb modified, patient is consuming approximately 100% of meals at this time. Labs and medications reviewed. No further nutrition interventions warranted at this time. RD contact information provided. If additional nutrition issues arise, please re-consult RD.  Jacob Barcelo A.  Jacob Smith, RD, LDN, CDE Pager: [phone] After hours Pager: [phone]

## 2016-07-11 NOTE — Progress Notes (Signed)
D/c  instructions reviewed with pt and his girlfriend/significant other. All education material for diabetes given to pt; insulin kit from here, Living Well with diabetes, nutrition information from dietician, and other education information printed out with d/c instructions. Pt given copy of instructions and scripts.   Pt had no further questions at this time, will f/u tomorrow with new MD that pt's girlfriend has made an appointment for at her MD's office.  Pt and girlfriend feel confident that he will be able to manage his diabetes with meds, diet and exercise.

## 2016-07-11 NOTE — Progress Notes (Signed)
Pt d/c'd with belongings with significant other, declined a wheelchair. Pt given note for his work as he requested after obtaining from MD.

## 2016-07-11 NOTE — Progress Notes (Signed)
Inpatient Diabetes Program Recommendations  AACE/ADA: New Consensus Statement on Inpatient Glycemic Control (2015)  Target Ranges:  Prepandial:   less than 140 mg/dL      Peak postprandial:   less than 180 mg/dL (1-2 hours)      Critically ill patients:  140 - 180 mg/dL   Lab Results  Component Value Date   GLUCAP 188 (H) 07/11/2016   HGBA1C 11.2 (H) 07/10/2016    Review of Glycemic Control HgbA1C indicates very poor glycemic control in the past 2-3 months. States he has no PCP and will call [employer] to get the names of MD's taking new patients. Gave insulin injection this am. Pt's fiance was previously on insulin prior to losing weight and will be a good support. Discussed glucose monitoring 3-4 times/day and taking logbook to MD office for any needed adjustments. Discussed how diet, exercise and stress affects blood sugars. Continues to view diabetes videos on pt ed channel.  *Pt states he has a "good" [employer] policy and said it should cover prescribed insulin.  Inpatient Diabetes Program Recommendations:    Increase Lantus to 24 units QD Add meal coverage insulin - Novolog 6 units tidwc Will need prescription for meter and supplies. To obtain PCP to manage his diabetes.  RN working with pt on insulin administration. Gave case Music therapist.  Thank you. Ailene Ards, RD, LDN, CDE Inpatient Diabetes Coordinator [phone]

## 2016-07-11 NOTE — Discharge Summary (Signed)
DISCHARGE SUMMARY  Jacob Smith.  MR#: [id number]  DOB:12/23/1965  Date of Admission: 07/09/2016 Date of Discharge: 07/11/2016  Attending Physician:Mariel Lukins T  Patient's PCP:No primary care provider on file.  Consults:  none  Disposition: D/C home   Follow-up Appts: Follow-up Information    Follow up with the doctor you have chosen this week to have your blood sugar log checked. .           Tests Needing Follow-up: -Close monitoring of CBGs is indicated -Consideration should be given to initiating an ACE inhibitor if the patient's renal function remains stable and outpatient follow-up  Discharge Diagnoses: New onset diabetes mellitus type 2 with HONK Acute renal injury-resolved Hyponatremia-resolved  Initial presentation: 50 y.o.malewho takes no medications on a regular basis, presented to the emergency department for evaluation of malaise, polyuria, and polydipsia for one month.  He endorsed a family history of non-insulin-dependent diabetes mellitus in his father, brother, and sister.  In the ED BMP was notable for sodium 124, serum creatinine 1.27, and serum glucose of 866. Serum bicarbonate was normal, as was anion gap. Urinalysis was notable for greater than 1000 glucose, but no ketones.   Hospital Course:  New onset diabetes mellitus - HONK Lantus insulin was initiated once the patient CBGs were brought under control using intravenous insulin drip - the patient was educated on administration of insulin injections as well as CBG checks - he was educated on a diabetic diet and the need to increase his physical activity to lose weight - the patient has multiple family members who are diabetic including his fiance and therefore assures me that he has a well-developed of assistance at home - the patient was highly motivated to be discharged 7/24 and given that his CBGs were significantly improved and he was otherwise clinically stable it was felt this was  reasonable - the patient was offered an additional night in the hospital to assure that he was fully educated on his new diagnosis and that his CBGs remained stable but he declined - the patient was advised that he would need very close follow-up with a ongoing primary care physician and he assured the dictating physician that he had actually arranged follow-up the following day with a new outpatient primary care doctor  Acute renal injury Due to severe dehydration/prerenal state related to severely uncontrolled diabetes - renal function had normalized at time of discharge  Hyponatremia + pseudohyponatremia Due to severely uncontrolled hyperglycemia as well as resultant dehydration - corrected with volume resuscitation with normal sodium at time of discharge    Medication List    TAKE these medications   aspirin 81 MG EC tablet Take 1 tablet (81 mg total) by mouth daily.   blood glucose meter kit and supplies Dispense based on patient and insurance preference. Use up to four times daily as directed. (FOR ICD-9 250.00, 250.01).   insulin glargine 100 UNIT/ML injection Commonly known as:  LANTUS Inject 0.24 mLs (24 Units total) into the skin daily. Start taking on:  07/12/2016   insulin starter kit- syringes Misc 1 kit by Other route once.       Day of Discharge BP 117/89 (BP Location: Right Arm)   Pulse 77   Temp 97.4 F (36.3 C) (Oral)   Resp (!) 23   Ht _0  (1.93 m)   Wt 105.6 kg (232 lb 12.9 oz)   SpO2 100%   BMI 28.34 kg/m   Physical Exam: General: No acute respiratory distress Lungs: Clear  to auscultation bilaterally without wheezes or crackles Cardiovascular: Regular rate and rhythm without murmur gallop or rub normal S1 and S2 Abdomen: Nontender, nondistended, soft, bowel sounds positive, no rebound, no ascites, no appreciable mass Extremities: No significant cyanosis, clubbing, or edema bilateral lower extremities  Basic Metabolic Panel:  Recent Labs Lab  07/09/16 2251 07/10/16 0622 07/10/16 1005 07/10/16 1354 07/11/16 0232  NA 124* 137 139 137 138  K 4.3 3.5 3.8 3.7 3.9  CL 92* 104 106 107 106  CO2 _0 GLUCOSE 866* 325* 153* 169* 270*  BUN _1 CREATININE 1.27* 1.04 1.00 0.99 1.13  CALCIUM 9.2 9.1 9.2 8.8* 8.8*    Liver Function Tests:  Recent Labs Lab 07/11/16 0232  AST 51*  ALT 61  ALKPHOS 81  BILITOT 0.7  PROT 6.2*  ALBUMIN 3.4*    CBC:  Recent Labs Lab 07/09/16 2251 07/11/16 0232  WBC 6.9 8.2  HGB 13.6 12.4*  HCT 41.5 38.5*  MCV 89.8 88.1  PLT 286 276    CBG:  Recent Labs Lab 07/10/16 1606 07/10/16 1709 07/10/16 2037 07/11/16 0758 07/11/16 1211  GLUCAP 176* 173* 293* 188* 239*    Recent Results (from the past 240 hour(s))  MRSA PCR Screening     Status: None   Collection Time: 07/10/16  6:50 AM  Result Value Ref Range Status   MRSA by PCR NEGATIVE NEGATIVE Final    Comment:        The GeneXpert MRSA Assay (FDA approved for NASAL specimens only), is one component of a comprehensive MRSA colonization surveillance program. It is not intended to diagnose MRSA infection nor to guide or monitor treatment for MRSA infections.      Time spent in discharge (includes decision making & examination of pt): <30 minutes  07/11/2016, 5:06 PM   Cherene Altes, MD [facility] Office  [phone] Pager [phone]  On-Call/Text Page:      [email or web address]      password [facility]

## 2016-07-11 NOTE — Progress Notes (Signed)
Discussed, demonstrated and had pt return demo of administration of insulin injection. Had pt read/measure amount of insulin already drawn up in syringe, pt able to do so, will need more practice though. Discussed rotating injection sites.   DM education videos started.  Pt's girlfriend at bedside, she states she will be able to help or give pt his insulin injections, states she use to have to take insulin injections (now on oral med since she lost weight).  Encouraged pt to continue to learn and be able to give self injections. ]  Living Well with Diabetes booklet and insulin kit given to pt--discussed contents briefly.  Videos started.

## 2016-07-11 NOTE — Progress Notes (Signed)
Pt has watched videos # [phone] --pt fell asleep after 503 pt states.  Encouraged pt to plan to watch more when lunch arrived.   Spoke with a DM coordinator, informed pt may go home today, but will need more teaching to be ready for home, outpatient teaching would benefit pt also.

## 2016-07-12 LAB — GLUCOSE, CAPILLARY
Glucose-Capillary: 367 mg/dL — ABNORMAL HIGH (ref 65–99)
Glucose-Capillary: 439 mg/dL — ABNORMAL HIGH (ref 65–99)

## 2016-07-12 LAB — ANTI-ISLET CELL ANTIBODY: PANCREATIC ISLET CELL ANTIBODY: NEGATIVE

## 2016-07-15 LAB — INSULIN ANTIBODIES, BLOOD: Insulin Antibodies, Human: 5.5 uU/mL — ABNORMAL HIGH

## 2016-08-11 ENCOUNTER — Ambulatory Visit: Payer: BLUE CROSS/BLUE SHIELD | Admitting: Dietician

## 2017-04-12 ENCOUNTER — Encounter: Payer: Self-pay | Admitting: Nurse Practitioner

## 2020-09-24 ENCOUNTER — Encounter (HOSPITAL_COMMUNITY): Payer: Self-pay

## 2020-09-24 ENCOUNTER — Emergency Department (HOSPITAL_COMMUNITY): Payer: BC Managed Care – PPO

## 2020-09-24 ENCOUNTER — Emergency Department (HOSPITAL_COMMUNITY)
Admission: EM | Admit: 2020-09-24 | Discharge: 2020-09-24 | Disposition: A | Discharge status: Home / Self Care | Payer: BC Managed Care – PPO | Attending: Emergency Medicine | Admitting: Emergency Medicine

## 2020-09-24 ENCOUNTER — Other Ambulatory Visit: Payer: Self-pay

## 2020-09-24 DIAGNOSIS — N179 Acute kidney failure, unspecified: Secondary | ICD-10-CM | POA: Diagnosis not present

## 2020-09-24 DIAGNOSIS — Z20822 Contact with and (suspected) exposure to covid-19: Secondary | ICD-10-CM | POA: Insufficient documentation

## 2020-09-24 DIAGNOSIS — R739 Hyperglycemia, unspecified: Secondary | ICD-10-CM

## 2020-09-24 DIAGNOSIS — R930 Abnormal findings on diagnostic imaging of skull and head, not elsewhere classified: Secondary | ICD-10-CM | POA: Insufficient documentation

## 2020-09-24 DIAGNOSIS — R5383 Other fatigue: Secondary | ICD-10-CM | POA: Diagnosis present

## 2020-09-24 DIAGNOSIS — E1165 Type 2 diabetes mellitus with hyperglycemia: Secondary | ICD-10-CM | POA: Diagnosis not present

## 2020-09-24 LAB — URINALYSIS, ROUTINE W REFLEX MICROSCOPIC
Bacteria, UA: NONE SEEN
Bilirubin Urine: NEGATIVE
Glucose, UA: >=500 mg/dL — AB
Hgb urine dipstick: NEGATIVE
Ketones, ur: 5 mg/dL — AB
Leukocytes,Ua: NEGATIVE
Nitrite: NEGATIVE
Protein, ur: NEGATIVE mg/dL
Specific Gravity, Urine: 1.026 (ref 1.005–1.030)
pH: 7 (ref 5.0–8.0)

## 2020-09-24 LAB — COMPREHENSIVE METABOLIC PANEL
ALT: 41 U/L (ref 0–44)
AST: 34 U/L (ref 15–41)
Albumin: 4.4 g/dL (ref 3.5–5.0)
Alkaline Phosphatase: 94 U/L (ref 38–126)
Anion gap: 8 (ref 5–15)
BUN: 16 mg/dL (ref 6–20)
CO2: 25 mmol/L (ref 22–32)
Calcium: 9.3 mg/dL (ref 8.9–10.3)
Chloride: 94 mmol/L — ABNORMAL LOW (ref 98–111)
Creatinine, Ser: 1.47 mg/dL — ABNORMAL HIGH (ref 0.61–1.24)
GFR calc non Af Amer: 54 mL/min — ABNORMAL LOW (ref >60)
Glucose, Bld: 823 mg/dL (ref 70–99)
Potassium: 5.6 mmol/L — ABNORMAL HIGH (ref 3.5–5.1)
Sodium: 127 mmol/L — ABNORMAL LOW (ref 135–145)
Total Bilirubin: 0.8 mg/dL (ref 0.3–1.2)
Total Protein: 7.7 g/dL (ref 6.5–8.1)

## 2020-09-24 LAB — CBC WITH DIFFERENTIAL/PLATELET
Abs Immature Granulocytes: 0.02 10*3/uL (ref 0.00–0.07)
Basophils Absolute: 0.1 10*3/uL (ref 0.0–0.1)
Basophils Relative: 1 %
Eosinophils Absolute: 0.1 10*3/uL (ref 0.0–0.5)
Eosinophils Relative: 1 %
HCT: 43.4 % (ref 39.0–52.0)
Hemoglobin: 14.3 g/dL (ref 13.0–17.0)
Immature Granulocytes: 0 %
Lymphocytes Relative: 33 %
Lymphs Abs: 2.4 10*3/uL (ref 0.7–4.0)
MCH: 29.2 pg (ref 26.0–34.0)
MCHC: 32.9 g/dL (ref 30.0–36.0)
MCV: 88.8 fL (ref 80.0–100.0)
Monocytes Absolute: 0.5 10*3/uL (ref 0.1–1.0)
Monocytes Relative: 7 %
Neutro Abs: 4 10*3/uL (ref 1.7–7.7)
Neutrophils Relative %: 58 %
Platelets: 324 10*3/uL (ref 150–400)
RBC: 4.89 MIL/uL (ref 4.22–5.81)
RDW: 12.3 % (ref 11.5–15.5)
WBC: 7.1 10*3/uL (ref 4.0–10.5)
nRBC: 0 % (ref 0.0–0.2)

## 2020-09-24 LAB — BLOOD GAS, VENOUS
Acid-base deficit: 3.5 mmol/L — ABNORMAL HIGH (ref 0.0–2.0)
Bicarbonate: 21.9 mmol/L (ref 20.0–28.0)
O2 Saturation: 45 %
Patient temperature: 98.6
pCO2, Ven: 43.5 mmHg — ABNORMAL LOW (ref 44.0–60.0)
pH, Ven: 7.323 (ref 7.250–7.430)
pO2, Ven: 31.8 mmHg — CL (ref 32.0–45.0)

## 2020-09-24 LAB — RESPIRATORY PANEL BY RT PCR (FLU A&B, COVID)
Influenza A by PCR: NEGATIVE
Influenza B by PCR: NEGATIVE
SARS Coronavirus 2 by RT PCR: NEGATIVE

## 2020-09-24 LAB — CBG MONITORING, ED
Glucose-Capillary: >600 mg/dL (ref 70–99)
Glucose-Capillary: >600 mg/dL (ref 70–99)
Glucose-Capillary: 467 mg/dL — ABNORMAL HIGH (ref 70–99)

## 2020-09-24 MED ORDER — METFORMIN HCL 500 MG PO TABS
500.0000 mg | ORAL_TABLET | Freq: Once | ORAL | Status: AC
  Administered 2020-09-24: 500 mg via ORAL
  Filled 2020-09-24: qty 1

## 2020-09-24 MED ORDER — SODIUM CHLORIDE 0.9 % IV BOLUS
1000.0000 mL | Freq: Once | INTRAVENOUS | Status: AC
  Administered 2020-09-24: 1000 mL via INTRAVENOUS

## 2020-09-24 MED ORDER — INSULIN ASPART 100 UNIT/ML ~~LOC~~ SOLN
6.0000 [IU] | Freq: Once | SUBCUTANEOUS | Status: AC
  Administered 2020-09-24: 6 [IU] via INTRAVENOUS
  Filled 2020-09-24: qty 0.06

## 2020-09-24 MED ORDER — INSULIN ASPART 100 UNIT/ML ~~LOC~~ SOLN
10.0000 [IU] | Freq: Once | SUBCUTANEOUS | Status: AC
  Administered 2020-09-24: 10 [IU] via INTRAVENOUS
  Filled 2020-09-24: qty 0.1

## 2020-09-24 MED ORDER — METFORMIN HCL 500 MG PO TABS: 500.0000 mg | ORAL_TABLET | ORAL | 1 refills | Status: DC

## 2020-09-24 NOTE — Discharge Instructions (Signed)
Your blood sugar was high today. It is important that you take your metformin.   I have provided you a prescription. Follow up with your primary care doctor to get refills.   Return to the Emergency Dept for any vomiting, abdominal pain, chest pain or trouble breathing.

## 2020-09-24 NOTE — ED Provider Notes (Signed)
[facility] DEPT Provider Note   CSN: [id number] Arrival date & time: 09/24/20  1546     History Chief Complaint  Patient presents with  . Fatigue  . Headache    Jacob Smith. is a 54 y.o. male past history of diabetes who presents for evaluation of fatigue that has been ongoing for about a week and a half.  He states that initially, he thought it was because of his Covid vaccine which he received about a week and a half ago but states it is persisted.  He feels like he has no energy and has felt like he is going to pass out at times.  He states he never actually passed out.  He states he has not any nausea/vomiting.  He has a history of diabetes and states he takes medication.  He also reports that he has been having intermittent headaches for the last few months.  He states that these will occur randomly or not tied to any specific action.  He states that he will get a headache and states that it will last for few minutes and then dissipate by itself.  He states he has also had some intermittent blurry vision with this.  He has not sought evaluation for this.  He states that he has not had any chest pain or difficulty breathing.  He has not had any cough, nausea/vomiting, abdominal pain. He denies any fevers, vision changes, numbness/weakness of his arms or legs.  The history is provided by the patient.       History reviewed. No pertinent past medical history.  Patient Active Problem List   Diagnosis Date Noted  . Diabetes mellitus, new onset ([place]) 07/10/2016  . Newly diagnosed diabetes ([place]) 07/10/2016    History reviewed. No pertinent surgical history.     History reviewed. No pertinent family history.  Social History   Tobacco Use  . Smoking status: Never Smoker  Substance Use Topics  . Alcohol use: No  . Drug use: Not on file    Home Medications Prior to Admission medications   Medication Sig Start Date End Date Taking?  Authorizing Provider  aspirin EC 81 MG EC tablet Take 1 tablet (81 mg total) by mouth daily. Patient not taking: Reported on 09/24/2020 07/11/16   Cherene Altes, MD  blood glucose meter kit and supplies Dispense based on patient and insurance preference. Use up to four times daily as directed. (FOR ICD-9 250.00, 250.01). 07/11/16   Cherene Altes, MD  insulin glargine (LANTUS) 100 UNIT/ML injection Inject 0.24 mLs (24 Units total) into the skin daily. Patient not taking: Reported on 09/24/2020 07/12/16   Cherene Altes, MD  metFORMIN (GLUCOPHAGE) 500 MG tablet Take 1 tablet (500 mg total) by mouth See admin instructions. One tablet in the morning and 2 tablets in the evening 09/24/20 10/24/20  Volanda Napoleon, PA-C    Allergies    Patient has no known allergies.  Review of Systems   Review of Systems  Constitutional: Positive for fatigue. Negative for fever.  Eyes: Positive for visual disturbance (intermittent).  Respiratory: Negative for cough and shortness of breath.   Cardiovascular: Negative for chest pain.  Gastrointestinal: Negative for abdominal pain, nausea and vomiting.  Neurological: Positive for headaches (intermittent).  All other systems reviewed and are negative.   Physical Exam Updated Vital Signs BP 126/83   Pulse 63   Temp 98.1 F (36.7 C) (Oral)   Resp 16   SpO2  98%   Physical Exam Vitals and nursing note reviewed.  Constitutional:      Appearance: Normal appearance. He is well-developed.  HENT:     Head: Normocephalic and atraumatic.  Eyes:     General: Lids are normal.     Conjunctiva/sclera: Conjunctivae normal.     Pupils: Pupils are equal, round, and reactive to light.     Comments: PERRL. EOMs intact. No nystagmus. No neglect.   Neck:     Vascular: No carotid bruit.     Comments: No carotid bruit Cardiovascular:     Rate and Rhythm: Normal rate and regular rhythm.     Pulses: Normal pulses.     Heart sounds: Normal heart sounds. No  murmur heard.  No friction rub. No gallop.   Pulmonary:     Effort: Pulmonary effort is normal.     Breath sounds: Normal breath sounds.     Comments: Lungs clear to auscultation bilaterally.  Symmetric chest rise.  No wheezing, rales, rhonchi. Abdominal:     Palpations: Abdomen is soft. Abdomen is not rigid.     Tenderness: There is no abdominal tenderness. There is no guarding.  Musculoskeletal:        General: Normal range of motion.     Cervical back: Full passive range of motion without pain.  Skin:    General: Skin is warm and dry.     Capillary Refill: Capillary refill takes less than 2 seconds.  Neurological:     Mental Status: He is alert and oriented to person, place, and time.     Comments: Cranial nerves III-XII intact Follows commands, Moves all extremities  5/5 strength to BUE and BLE  Sensation intact throughout all major nerve distributions No pronator drift. No gait abnormalities  No slurred speech. No facial droop.   Psychiatric:        Speech: Speech normal.     ED Results / Procedures / Treatments   Labs (all labs ordered are listed, but only abnormal results are displayed) Labs Reviewed  COMPREHENSIVE METABOLIC PANEL - Abnormal; Notable for the following components:      Result Value   Sodium 127 (*)    Potassium 5.6 (*)    Chloride 94 (*)    Glucose, Bld 823 (*)    Creatinine, Ser 1.47 (*)    GFR calc non Af Amer 54 (*)    All other components within normal limits  URINALYSIS, ROUTINE W REFLEX MICROSCOPIC - Abnormal; Notable for the following components:   Color, Urine COLORLESS (*)    Glucose, UA >=500 (*)    Ketones, ur 5 (*)    All other components within normal limits  BLOOD GAS, VENOUS - Abnormal; Notable for the following components:   pCO2, Ven 43.5 (*)    pO2, Ven 31.8 (*)    Acid-base deficit 3.5 (*)    All other components within normal limits  CBG MONITORING, ED - Abnormal; Notable for the following components:   Glucose-Capillary  >600 (*)    All other components within normal limits  CBG MONITORING, ED - Abnormal; Notable for the following components:   Glucose-Capillary >600 (*)    All other components within normal limits  CBG MONITORING, ED - Abnormal; Notable for the following components:   Glucose-Capillary 467 (*)    All other components within normal limits  RESPIRATORY PANEL BY RT PCR (FLU A&B, COVID)  CBC WITH DIFFERENTIAL/PLATELET    EKG EKG Interpretation  Date/Time:  Thursday September 24 2020 19:24:27 EDT Ventricular Rate:  65 PR Interval:    QRS Duration: 93 QT Interval:  394 QTC Calculation: 410 R Axis:   69 Text Interpretation: Sinus rhythm No previous tracing Confirmed by Blanchie Dessert [id number]) on 09/24/2020 7:38:08 PM   Radiology CT Head Wo Contrast  Result Date: 09/24/2020 CLINICAL DATA:  Headache EXAM: CT HEAD WITHOUT CONTRAST TECHNIQUE: Contiguous axial images were obtained from the base of the skull through the vertex without intravenous contrast. COMPARISON:  None. FINDINGS: Brain: Normal anatomic configuration. No abnormal intra or extra-axial mass lesion or fluid collection. No abnormal mass effect or midline shift. No evidence of acute intracranial hemorrhage or infarct. Ventricular size is normal. Cerebellum unremarkable. Vascular: Unremarkable Skull: Intact Sinuses/Orbits: Paranasal sinuses are clear. Orbits are unremarkable. Other: Mastoid air cells and middle ear cavities are clear. IMPRESSION: Normal head CT Electronically Signed   By: Fidela Salisbury MD   On: 09/24/2020 18:06    Procedures Procedures (including critical care time)  Medications Ordered in ED Medications  sodium chloride 0.9 % bolus 1,000 mL (0 mLs Intravenous Stopped 09/24/20 1922)  insulin aspart (novoLOG) injection 6 Units (6 Units Intravenous Given 09/24/20 1954)  insulin aspart (novoLOG) injection 10 Units (10 Units Intravenous Given 09/24/20 2210)  metFORMIN (GLUCOPHAGE) tablet 500 mg (500 mg Oral Given  09/24/20 2327)    ED Course  I have reviewed the triage vital signs and the nursing notes.  Pertinent labs & imaging results that were available during my care of the patient were reviewed by me and considered in my medical decision making (see chart for details).    MDM Rules/Calculators/A&P                          54 year old male who presents for evaluation of fatigue x1.5 weeks.  Also reports some intermittent headache, blurry vision has been ongoing for the last several months.  He has not sought evaluation for this.  On initial arrival, he is afebrile, nontoxic-appearing.  Vital signs are stable.  On exam, no neuro deficits.  History/physical exam is not concerning for intracranial hemorrhage, CVA.  His symptoms been ongoing for several months and do not suspect acute he CVA is a source of his symptoms.  His headaches are intermittently occurring and randomly occur without any specific action.  He states that sometimes he gets blurry vision with them but this eventually resolves.  He does state that he has a history of eye issues.  He also reports prolonged fatigue that has been ongoing.  He does state that this initially started after getting his Covid vaccine but will plan to check labs given his history of diabetes.  We will plan to check labs, head CT.   CMP shows sodium of 127, potassium of 5.6, glucose of 823, BUN of 16, creatinine of 1.47.  Anion gap is 8.  His corrected sodium is 139.  CBC without any significant leukocytosis or anemia. UA shows small ketones. No infections. VBG shows pH of 7.323. Bicarb is 21.9. PO2 is 31.8.  His baseline creatinine is slightly up.  He is potassium is 5.6 but will give insulin for his sugar which should adjust the potassium. Anion gap is within normal limits.    I discussed blood sugar results with patient.  He tells me he has been out of his Metformin for about a month.  He is also had some increased thirst.  I suspect this is where his fatigue  is  coming from.  At this time, his work-up does not look concerning for DKA.  He will be given fluids, insulin and his blood sugar will be rechecked.  CT head without any acute abnormalities.  CBG is greater than 600. This was prior to getting insulin. We'll plan to give additional insulin have an additional reassessment of his blood sugar.  Patient's repeat CBG is 467.   Discussed results with patient.  He is resting comfortably on bed with no signs of distress.  He states he feels comfortable going home.  At this time, no neuro deficits.  He is hemodynamically stable and is not in DKA.  We will plan to give him a refill of his Metformin.  Patient does have a primary care doctor.  He states he has not seen her in several months.  I discussed with him that he needs to follow-up with her and get a refill of medication. At this time, patient exhibits no emergent life-threatening condition that require further evaluation in ED. Patient had ample opportunity for questions and discussion. All patient's questions were answered with full understanding. Strict return precautions discussed. Patient expresses understanding and agreement to plan.   Portions of this note were generated with Lobbyist. Dictation errors may occur despite best attempts at proofreading.  Final Clinical Impression(s) / ED Diagnoses Final diagnoses:  Hyperglycemia  AKI (acute kidney injury) ([place])    Rx / DC Orders ED Discharge Orders         Ordered    metFORMIN (GLUCOPHAGE) 500 MG tablet  See admin instructions        09/24/20 2318           Volanda Napoleon, PA-C 09/25/20 1501    Blanchie Dessert, MD 09/28/20 [phone]

## 2020-09-24 NOTE — ED Triage Notes (Signed)
Pt presents with c/o fatigue and headache. Pt reports he hasn't felt good since he received his covid vaccine 1.5 weeks ago.

## 2020-09-24 NOTE — ED Notes (Signed)
CRITICAL VALUE STICKER  CRITICAL VALUE: Glucose 823  RECEIVER (on-site recipient of call): CSimpson, RN   DATE & TIME NOTIFIED: 09/24/20 1833  MESSENGER (representative from lab):LBillingsley  MD NOTIFIED: PA Mardella Layman  TIME OF NOTIFICATION: 1835  RESPONSE: see orders

## 2021-04-22 IMAGING — CT CT HEAD W/O CM
3 series · 16 of 47 positions shown, 19 images · non-contrast
Comparison: None.

CLINICAL DATA: Headache

EXAM:
CT HEAD WITHOUT CONTRAST
TECHNIQUE: Contiguous axial images were obtained from the base of the skull
through the vertex without intravenous contrast.

[Series 3: head wo · axial · 0.45mm/px · z∈[-203,-73]mm · 10 of 32 slices shown, 13 images]
[im 3/32  brain]
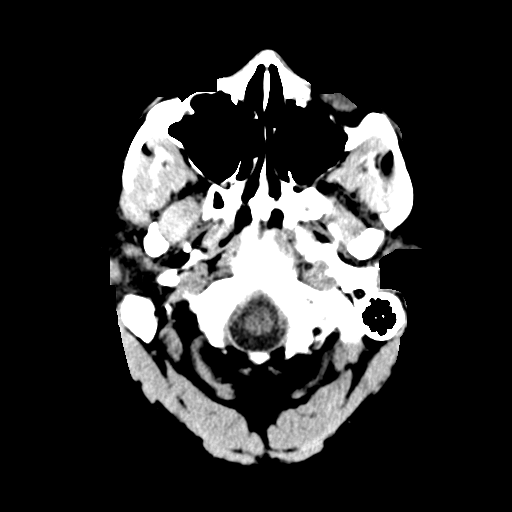
[im 3/32  bone]
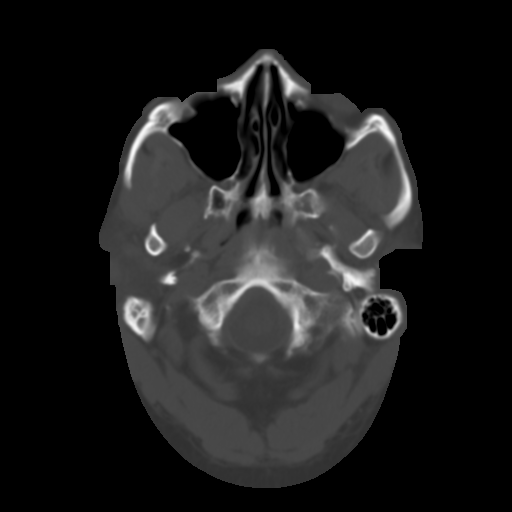
[im 6/32  brain]
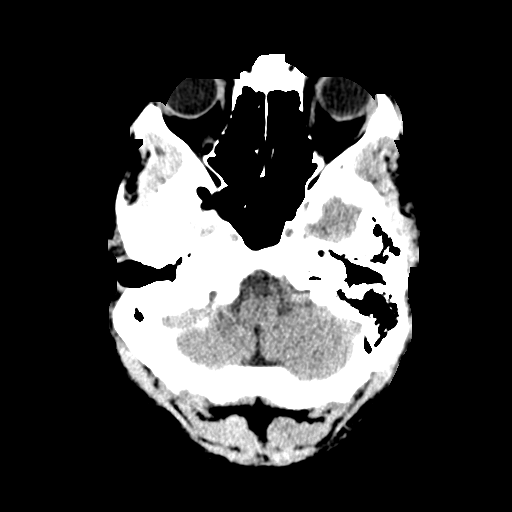
[im 9/32  brain]
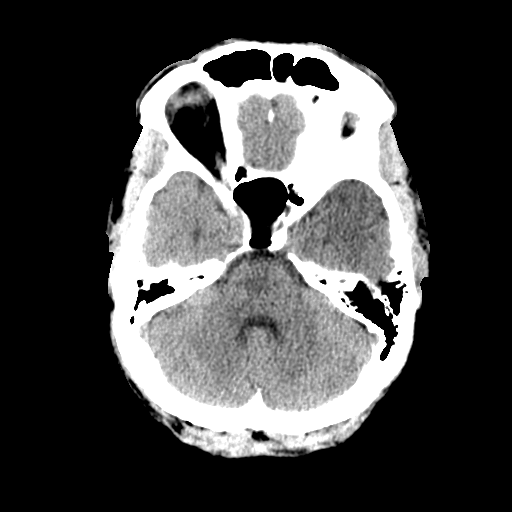
[im 11/32  brain]
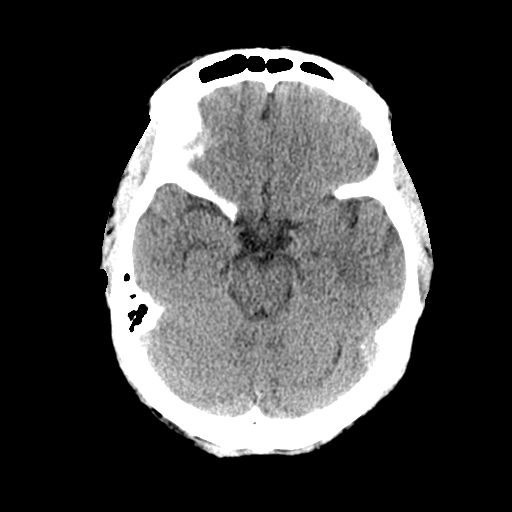
[im 14/32  brain]
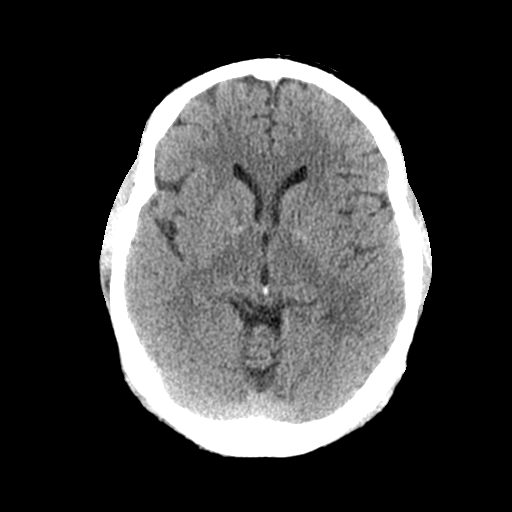
[im 14/32  bone]
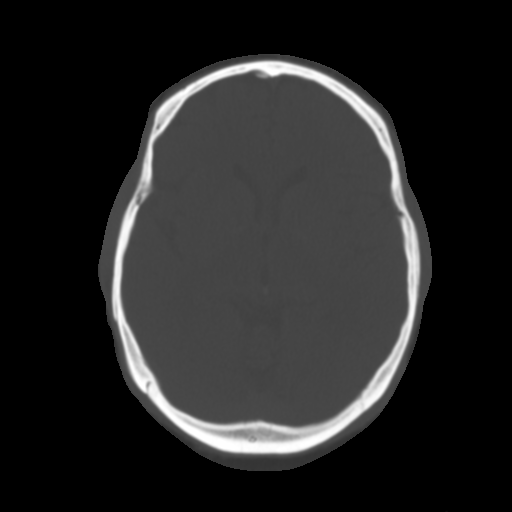
[im 18/32  brain]
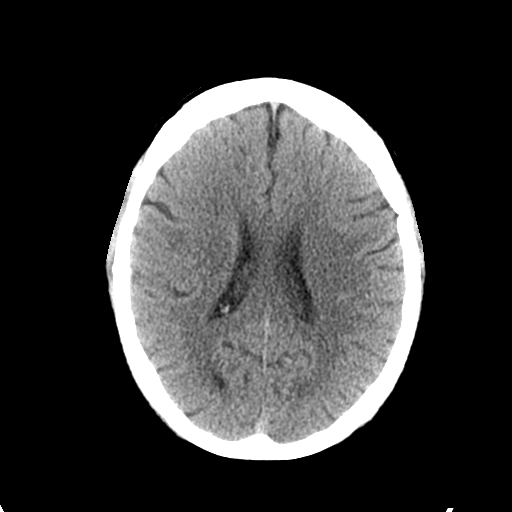
[im 21/32  brain]
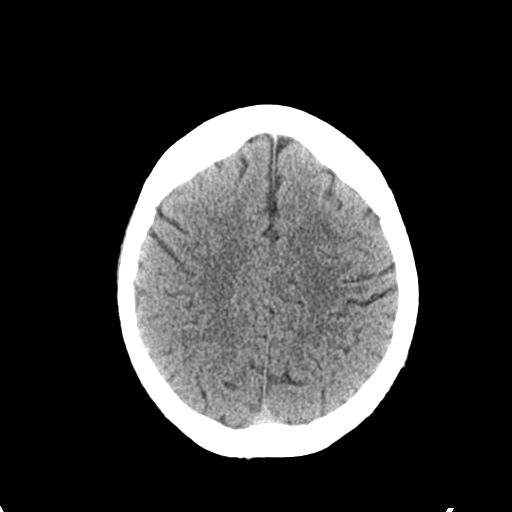
[im 24/32  brain]
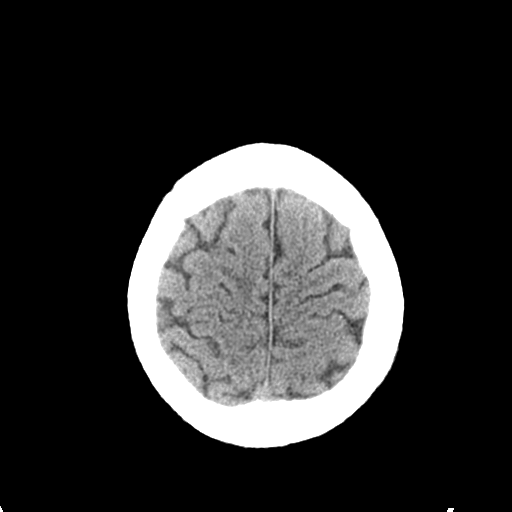
[im 26/32  brain]
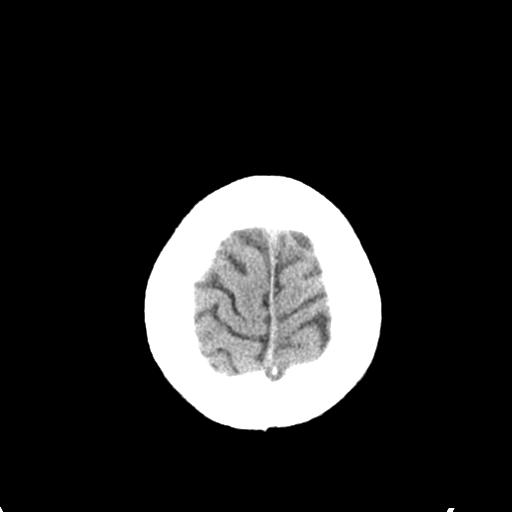
[im 26/32  bone]
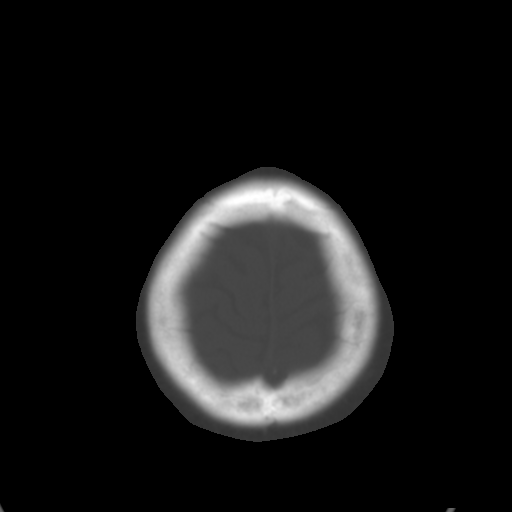
[im 29/32  brain]
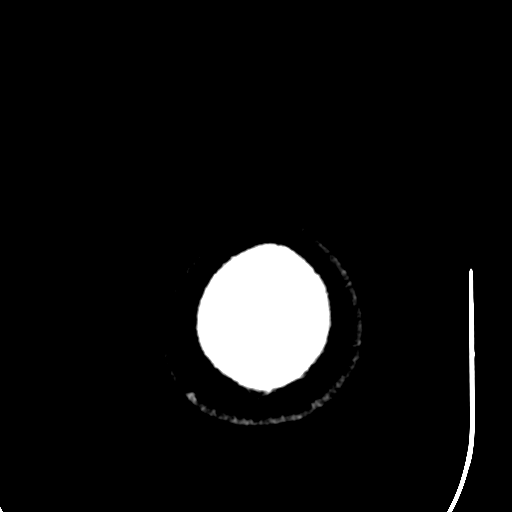

[Series 5: coronal soft tissue · coronal · 0.31mm/px · 3 of 67 slices shown]
[im 23/67  brain]
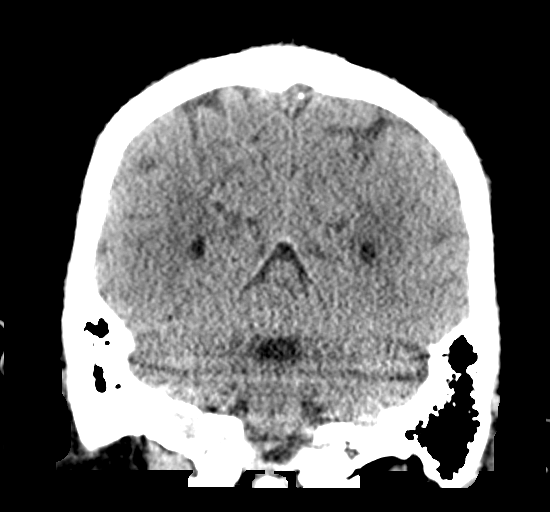
[im 30/67  brain]
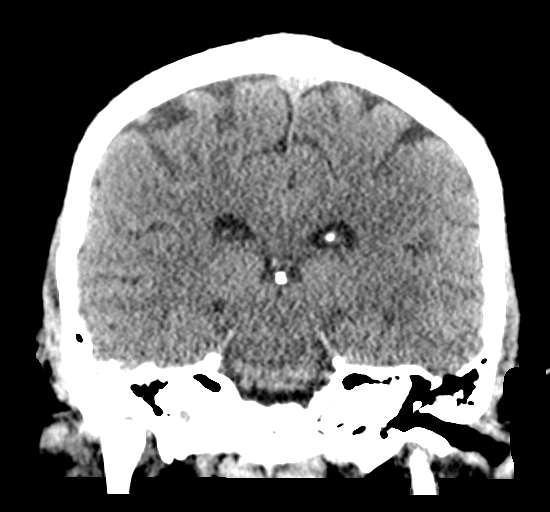
[im 37/67  brain]
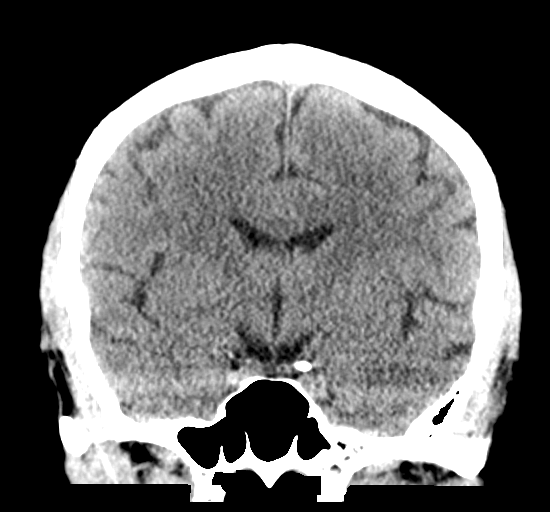

[Series 6: sagittal soft tissue · sagittal · 0.31mm/px · 3 of 58 slices shown]
[im 20/58  brain]
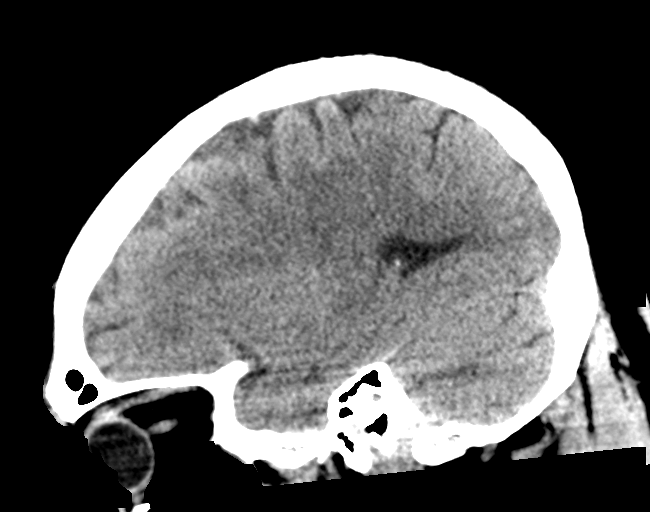
[im 29/58  brain]
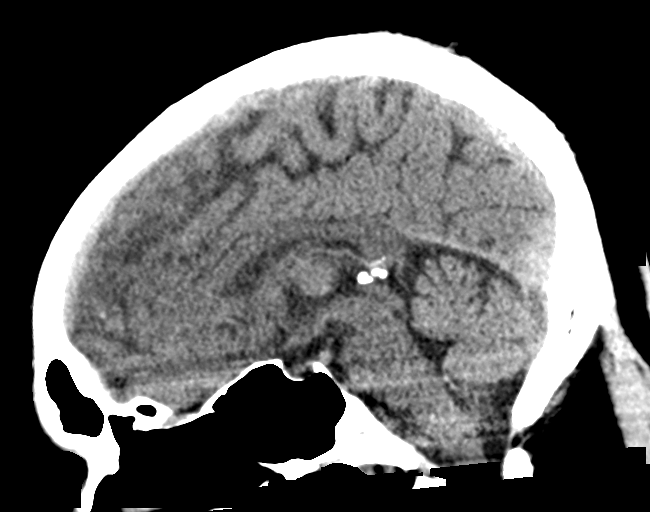
[im 39/58  brain]
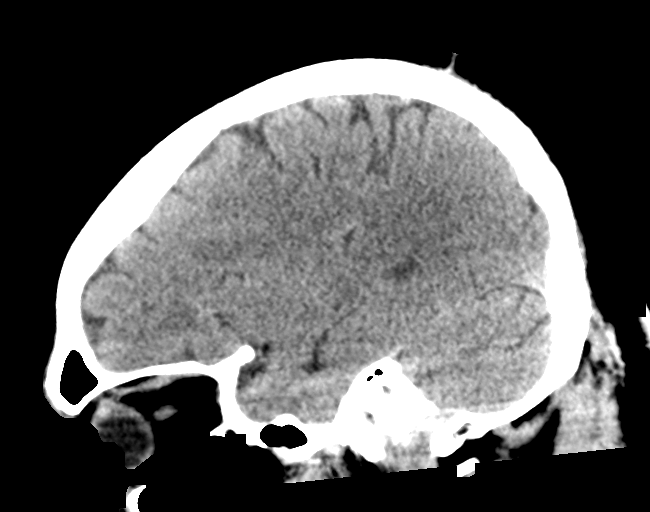

[16 of 47 positions shown; findings below may reference images not displayed]

FINDINGS: Brain: Normal anatomic configuration. No abnormal intra or
extra-axial mass lesion or fluid collection. No abnormal mass effect
or midline shift. No evidence of acute intracranial hemorrhage or
infarct. Ventricular size is normal. Cerebellum unremarkable.

Vascular: Unremarkable

Skull: Intact

Sinuses/Orbits: Paranasal sinuses are clear. Orbits are
unremarkable.

Other: Mastoid air cells and middle ear cavities are clear.
IMPRESSION: Normal head CT

## 2023-06-21 ENCOUNTER — Ambulatory Visit (INDEPENDENT_AMBULATORY_CARE_PROVIDER_SITE_OTHER): Payer: Commercial Managed Care - PPO | Admitting: Internal Medicine

## 2023-06-21 ENCOUNTER — Encounter: Payer: Self-pay | Admitting: Internal Medicine

## 2023-06-21 ENCOUNTER — Telehealth: Payer: Self-pay | Admitting: Internal Medicine

## 2023-06-21 VITALS — BP 102/84 | HR 97 | Temp 98.3°F | Ht 76.0 in | Wt 190.6 lb

## 2023-06-21 DIAGNOSIS — E1165 Type 2 diabetes mellitus with hyperglycemia: Secondary | ICD-10-CM

## 2023-06-21 DIAGNOSIS — Z9189 Other specified personal risk factors, not elsewhere classified: Secondary | ICD-10-CM | POA: Diagnosis not present

## 2023-06-21 DIAGNOSIS — Z794 Long term (current) use of insulin: Secondary | ICD-10-CM

## 2023-06-21 DIAGNOSIS — E0865 Diabetes mellitus due to underlying condition with hyperglycemia: Secondary | ICD-10-CM

## 2023-06-21 DIAGNOSIS — J029 Acute pharyngitis, unspecified: Secondary | ICD-10-CM

## 2023-06-21 DIAGNOSIS — N529 Male erectile dysfunction, unspecified: Secondary | ICD-10-CM | POA: Diagnosis not present

## 2023-06-21 DIAGNOSIS — E119 Type 2 diabetes mellitus without complications: Secondary | ICD-10-CM

## 2023-06-21 DIAGNOSIS — Z1211 Encounter for screening for malignant neoplasm of colon: Secondary | ICD-10-CM

## 2023-06-21 LAB — LDL CHOLESTEROL, DIRECT: Direct LDL: 133 mg/dL

## 2023-06-21 LAB — CBC WITH DIFFERENTIAL/PLATELET
Basophils Absolute: 0 10*3/uL (ref 0.0–0.1)
Basophils Relative: 0.5 % (ref 0.0–3.0)
Eosinophils Absolute: 0.1 10*3/uL (ref 0.0–0.7)
Eosinophils Relative: 1 % (ref 0.0–5.0)
HCT: 43.7 % (ref 39.0–52.0)
Hemoglobin: 14.4 g/dL (ref 13.0–17.0)
Lymphocytes Relative: 40.1 % (ref 12.0–46.0)
Lymphs Abs: 2.9 10*3/uL (ref 0.7–4.0)
MCHC: 33 g/dL (ref 30.0–36.0)
MCV: 87.3 fl (ref 78.0–100.0)
Monocytes Absolute: 0.5 10*3/uL (ref 0.1–1.0)
Monocytes Relative: 6.7 % (ref 3.0–12.0)
Neutro Abs: 3.8 10*3/uL (ref 1.4–7.7)
Neutrophils Relative %: 51.7 % (ref 43.0–77.0)
Platelets: 336 10*3/uL (ref 150.0–400.0)
RBC: 5.01 Mil/uL (ref 4.22–5.81)
RDW: 12.8 % (ref 11.5–15.5)
WBC: 7.3 10*3/uL (ref 4.0–10.5)

## 2023-06-21 LAB — MICROALBUMIN / CREATININE URINE RATIO
Creatinine,U: 59.2 mg/dL
Microalb Creat Ratio: 2 mg/g (ref 0.0–30.0)
Microalb, Ur: 1.2 mg/dL (ref 0.0–1.9)

## 2023-06-21 LAB — LIPID PANEL
Cholesterol: 245 mg/dL — ABNORMAL HIGH (ref 0–200)
HDL: 38.6 mg/dL — ABNORMAL LOW (ref >39.00)
NonHDL: 206.43
Total CHOL/HDL Ratio: 6
Triglycerides: 253 mg/dL — ABNORMAL HIGH (ref 0.0–149.0)
VLDL: 50.6 mg/dL — ABNORMAL HIGH (ref 0.0–40.0)

## 2023-06-21 LAB — COMPREHENSIVE METABOLIC PANEL
ALT: 18 U/L (ref 0–53)
AST: 15 U/L (ref 0–37)
Albumin: 4.6 g/dL (ref 3.5–5.2)
Alkaline Phosphatase: 82 U/L (ref 39–117)
BUN: 13 mg/dL (ref 6–23)
CO2: 30 mEq/L (ref 19–32)
Calcium: 10.4 mg/dL (ref 8.4–10.5)
Chloride: 99 mEq/L (ref 96–112)
Creatinine, Ser: 1.11 mg/dL (ref 0.40–1.50)
GFR: 74.28 mL/min (ref >60.00)
Glucose, Bld: 419 mg/dL — ABNORMAL HIGH (ref 70–99)
Potassium: 4.7 mEq/L (ref 3.5–5.1)
Sodium: 137 mEq/L (ref 135–145)
Total Bilirubin: 0.4 mg/dL (ref 0.2–1.2)
Total Protein: 7.5 g/dL (ref 6.0–8.3)

## 2023-06-21 LAB — HEMOGLOBIN A1C: Hgb A1c MFr Bld: 16.8 % — ABNORMAL HIGH (ref 4.6–6.5)

## 2023-06-21 MED ORDER — BLOOD GLUCOSE TEST VI STRP: 1.0000 | ORAL_STRIP | Freq: Three times a day (TID) | 0 refills | Status: DC

## 2023-06-21 MED ORDER — ROSUVASTATIN CALCIUM 40 MG PO TABS: 40.0000 mg | ORAL_TABLET | Freq: Every day | ORAL | 3 refills | Status: AC

## 2023-06-21 MED ORDER — BLOOD GLUCOSE MONITORING SUPPL DEVI: 1.0000 | Freq: Three times a day (TID) | 0 refills | Status: AC

## 2023-06-21 MED ORDER — SILDENAFIL CITRATE 100 MG PO TABS: 100.0000 mg | ORAL_TABLET | ORAL | 11 refills | Status: AC | PRN

## 2023-06-21 MED ORDER — LANCETS MISC. MISC: 1.0000 | Freq: Three times a day (TID) | 0 refills | Status: AC

## 2023-06-21 MED ORDER — LANCET DEVICE MISC: 1.0000 | Freq: Three times a day (TID) | 0 refills | Status: AC

## 2023-06-21 MED ORDER — TOUJEO SOLOSTAR 300 UNIT/ML ~~LOC~~ SOPN: 24.0000 [IU] | PEN_INJECTOR | Freq: Every day | SUBCUTANEOUS | 3 refills | Status: DC

## 2023-06-21 MED ORDER — ASPIRIN 81 MG PO TBEC: 81.0000 mg | DELAYED_RELEASE_TABLET | Freq: Every day | ORAL | 12 refills | Status: AC

## 2023-06-21 MED ORDER — INSULIN ASPART (W/NIACINAMIDE) 100 UNIT/ML ~~LOC~~ SOPN: 5.0000 [IU] | PEN_INJECTOR | Freq: Three times a day (TID) | SUBCUTANEOUS | 1 refills | Status: DC

## 2023-06-21 NOTE — Telephone Encounter (Signed)
Pt states is medication  insulin glargine, 1 Unit Dial, (TOUJEO SOLOSTAR) 300 UNIT/ML Solostar Pen  Was too expensive, Dr Jon Billings was going to see other options. He states it is around $80. Please advise.

## 2023-06-21 NOTE — Assessment & Plan Note (Signed)
Erectile Dysfunction: He has been using Sildenafil (Viagra) for erectile dysfunction and will continue to use Sildenafil as needed.

## 2023-06-21 NOTE — Assessment & Plan Note (Signed)
High Risk for Cardiovascular Disease: With a family history of heart disease and long term uncontrolled diabetes, he is at high risk for cardiovascular disease. A referral to a cardiologist for further evaluation and management will be made, and starting a cholesterol medication and aspirin for cardiovascular protection will be arranged, and we will get baseline lab results.

## 2023-06-21 NOTE — Patient Instructions (Addendum)
VISIT SUMMARY:  During our visit, we discussed your concerns about your type 2 diabetes and your desire to improve its management. We also talked about your family history of diabetes and heart disease, and your use of Sildenafil for erectile dysfunction. We discussed your recent acquisition of insurance and your willingness to explore alternative treatments for your diabetes.  YOUR PLAN:  -TYPE 2 DIABETES: We will change your insulin regimen to Toujeo and Fiasp, which should help better control your blood sugar levels. We will also provide you with a new glucose monitor to regularly check your blood sugar. We will order a full diabetes lab panel to assess your current control and kidney function. We will also refer you to an eye doctor for a diabetic eye exam. After reviewing your lab results, we may consider adding a cholesterol medication and aspirin to protect your heart.  -HIGH RISK FOR CARDIOVASCULAR DISEASE: Given your family history and uncontrolled diabetes, you are at high risk for heart disease. We will refer you to a heart specialist for further evaluation and management. We may also start you on a cholesterol medication and aspirin for heart protection after we review your lab results.  -ERECTILE DYSFUNCTION: You can continue to use Sildenafil as needed for your erectile dysfunction.  -GENERAL HEALTH MAINTENANCE: We will order a Cologuard test for colon cancer screening. It's important to regularly check for other potential health issues, even as we focus on managing your diabetes.  INSTRUCTIONS:  Please start using the Toujeo and Fiasp insulin pens as directed. Regularly monitor your blood sugar levels with the new glucose monitor. Please also make appointments with the eye doctor and heart specialist we referred you to. We will schedule a follow-up appointment in 1-2 weeks to review your lab results and adjust your treatment plan as necessary.   Welcome aboard!   Today's visit was  a valuable first step in understanding your health and starting your personalized care journey. We discussed your medical history and medications in detail. Given the extensive information, we prioritized addressing your most pressing concerns.  We understood those concerns to be:  New Patient (Initial Visit)   Building a Complete Picture  To create the most effective care plan possible, we may need additional information from previous providers. We encouraged you to gather any relevant medical records for your next visit. This will help [place] build a more complete picture and develop a personalized plan together. In the meantime, we'll address your immediate concerns and provide resources to help you manage all of your medical issues.  We encourage you to use MyChart to review these efforts, and to help [place] find and correct any omissions or errors in your medical chart.  Managing Your Health Over Time  Managing every aspect of your health in a single visit isn't always feasible, but that's okay.  We addressed your most pressing concerns today and charted a course for future care. Acute conditions or preventive care measures may require further attention.  We encourage you to schedule a follow-up visit at your earliest convenience to discuss any unresolved issues.  We strongly encourage participation in annual preventive care visits to help [place] develop a more thorough understanding of your health and to help you maintain optimal wellness - please inquire about scheduling your next one with [place] at your earliest convenience.  Your Satisfaction Matters  It was a pleasure seeing you today!  Your health and satisfaction will always be my top priorities. If you believe your experience  today was worthy of a 5-star rating, I'd be grateful for your feedback!  Lula Olszewski, MD   Next Steps  Schedule Follow-Up:  We recommend a follow-up appointment in No follow-ups on file. If your condition worsens before  then, please call us or seek emergency care. Preventive Care:  Don't forget to schedule your annual preventive care visit!  This important checkup is typically covered by insurance and helps identify potential health issues early.  Typically its 100% insurance covered with no co-pay and helps to get surveillance labwork paid for through your insurance provider.  Sometimes it even lowers your insurance premiums to participate. Medical Information Release:  For any relevant medical information we don't have, please sign a release form so we can obtain it for your records. Lab & X-ray Appointments:  Scheduled any incomplete lab tests today or call us to schedule.  X-Rays can be done without an appointment at [facility] ([address], [address]), M-F 8:30am-noon or 1pm-5pm.  Just tell them you're there for X-rays ordered by Dr. Jon Billings.  We'll receive the results and contact you by phone or MyChart to discuss next steps.  Bring to Your Next Appointment  Medications: Please bring all your medication bottles to your next appointment to ensure we have an accurate record of your prescriptions. Health Diaries: If you're monitoring any health conditions at home, keeping a diary of your readings can be very helpful for discussions at your next appointment.  Reviewing Your Records  Please Review this early draft of your clinical notes below and the final encounter summary tomorrow on MyChart after its been completed.   Diabetes mellitus, new onset (HCC) -     Cologuard -     CBC with Differential/Platelet -     Comprehensive metabolic panel -     Lipid panel -     Hemoglobin A1c -     Microalbumin / creatinine urine ratio -     Insulin and C-Peptide -     Blood Glucose Monitoring Suppl; 1 each by Does not apply route in the morning, at noon, and at bedtime. May substitute to any manufacturer covered by patient's insurance.  Dispense: 1 each; Refill: 0 -     Blood Glucose Test; 1 each  by In Vitro route in the morning, at noon, and at bedtime. May substitute to any manufacturer covered by patient's insurance.  Dispense: 100 strip; Refill: 0 -     Lancet Device; 1 each by Does not apply route in the morning, at noon, and at bedtime. May substitute to any manufacturer covered by patient's insurance.  Dispense: 1 each; Refill: 0 -     Lancets Misc.; 1 each by Does not apply route in the morning, at noon, and at bedtime. May substitute to any manufacturer covered by patient's insurance.  Dispense: 100 each; Refill: 0 -     Toujeo SoloStar; Inject 24 Units into the skin daily. Replaces lantus.  Dispense: 90 mL; Refill: 3 -     Insulin Aspart (w/Niacinamide); Inject 5 Units into the skin 3 (three) times daily before meals.  Dispense: 9 mL; Refill: 1 -     Ambulatory referral to Ophthalmology -     Rosuvastatin Calcium; Take 1 tablet (40 mg total) by mouth daily.  Dispense: 90 tablet; Refill: 3  Screen for colon cancer -     Cologuard  Sore throat  At high risk for cardiovascular disease -     Ambulatory  referral to Cardiology -     Rosuvastatin Calcium; Take 1 tablet (40 mg total) by mouth daily.  Dispense: 90 tablet; Refill: 3 -     Aspirin; Take 1 tablet (81 mg total) by mouth daily. Swallow whole.  Dispense: 30 tablet; Refill: 12  Erectile dysfunction, unspecified erectile dysfunction type -     Sildenafil Citrate; Take 1 tablet (100 mg total) by mouth as needed for erectile dysfunction.  Dispense: 10 tablet; Refill: 11  Diabetes mellitus due to underlying condition, uncontrolled, with hyperglycemia (HCC)     Getting Answers and Following Up  Simple Questions & Concerns: For quick questions or basic follow-up after your visit, reach [place] at [phone] or MyChart messaging. Complex Concerns: If your concern is more complex, scheduling an appointment might be best. Discuss this with the staff to find the most suitable option. Lab & Imaging Results: We'll contact you  directly if results are abnormal or you don't use MyChart. Most normal results will be on MyChart within 2-3 business days, with a review message from Dr. Jon Billings. Haven't heard back in 2 weeks? Need results sooner? Contact us at [phone]. Referrals: Our referral coordinator will manage specialist referrals. The specialist's office should contact you within 2 weeks to schedule an appointment. Call us if you haven't heard from them after 2 weeks.  Staying Connected  MyChart: Activate your MyChart for the fastest way to access results and message [place]. See the last page of this paperwork for instructions.  Billing  X-ray & Lab Orders: These are billed by separate companies. Contact the invoicing company directly for questions or concerns. Visit Charges: Discuss any billing inquiries with our administrative services team.  Feedback & Satisfaction  Share Your Experience: We strive for your satisfaction! If you have any complaints, please let Dr. Jon Billings know directly or contact our Practice Administrators, Edwena Felty or [employer], by asking at the front desk.  Scheduling Tips  Shorter Wait Times: 8 am and 1 pm appointments often have the quickest wait times. Longer Appointments: If you need more time during your visit, talk to the front desk. Due to insurance regulations, multiple back-to-back appointments might be necessary.

## 2023-06-21 NOTE — Assessment & Plan Note (Signed)
Type 2 Diabetes Mellitus: Uncontrolled with a history of kidney damage, he has been inconsistently using Lantus insulin due to financial difficulties and has a family history of diabetes with complications. We will order a full diabetes lab panel to assess current control and kidney function, start Toujeo insulin pen 24 units daily as a basal dose to replace Lantus, and initiate Fiasp insulin pen 5 units TID before meals as a bolus dose. Regular blood glucose monitoring will be ensured with a new glucose monitor, and he will be referred to an ophthalmologist for a diabetic eye exam. After reviewing lab results, we will consider adding a cholesterol medication and aspirin for cardiovascular protection.  Not sure if type 2, will get insulin and C-peptide to confirm, and start with just insulin to be safe. Offered continuous glucose monitor but he doesn't like them.Jacob Smith

## 2023-06-21 NOTE — Progress Notes (Signed)
[facility]  Phone: [phone]  New patient visit  Visit Date: 06/21/2023 Patient: Jacob Smith.   DOB: 05-Jan-1966   57 y.o. Male  MRN: [id number] Patient Care Team: Lula Olszewski, MD as PCP - General (Internal Medicine) Today's Health Care Provider: Lula Olszewski, MD   Assessment and Plan:    Jarvon was seen today for new patient (initial visit).  Diabetes mellitus, new onset (HCC) -     CBC with Differential/Platelet -     Comprehensive metabolic panel -     Lipid panel -     Hemoglobin A1c -     Microalbumin / creatinine urine ratio -     Insulin and C-Peptide -     Blood Glucose Monitoring Suppl; 1 each by Does not apply route in the morning, at noon, and at bedtime. May substitute to any manufacturer covered by patient's insurance.  Dispense: 1 each; Refill: 0 -     Blood Glucose Test; 1 each by In Vitro route in the morning, at noon, and at bedtime. May substitute to any manufacturer covered by patient's insurance.  Dispense: 100 strip; Refill: 0 -     Lancet Device; 1 each by Does not apply route in the morning, at noon, and at bedtime. May substitute to any manufacturer covered by patient's insurance.  Dispense: 1 each; Refill: 0 -     Lancets Misc.; 1 each by Does not apply route in the morning, at noon, and at bedtime. May substitute to any manufacturer covered by patient's insurance.  Dispense: 100 each; Refill: 0 -     Toujeo SoloStar; Inject 24 Units into the skin daily. Replaces lantus.  Dispense: 90 mL; Refill: 3 -     Insulin Aspart (w/Niacinamide); Inject 5 Units into the skin 3 (three) times daily before meals.  Dispense: 9 mL; Refill: 1 -     Ambulatory referral to Ophthalmology -     Rosuvastatin Calcium; Take 1 tablet (40 mg total) by mouth daily.  Dispense: 90 tablet; Refill: 3  Screen for colon cancer -     Cologuard  Sore throat  At high risk for cardiovascular disease Assessment & Plan: High Risk for Cardiovascular  Disease: With a family history of heart disease and long term uncontrolled diabetes, he is at high risk for cardiovascular disease. A referral to a cardiologist for further evaluation and management will be made, and starting a cholesterol medication and aspirin for cardiovascular protection will be arranged, and we will get baseline lab results.  Orders: -     Ambulatory referral to Cardiology -     Rosuvastatin Calcium; Take 1 tablet (40 mg total) by mouth daily.  Dispense: 90 tablet; Refill: 3 -     Aspirin; Take 1 tablet (81 mg total) by mouth daily. Swallow whole.  Dispense: 30 tablet; Refill: 12  Erectile dysfunction, unspecified erectile dysfunction type Assessment & Plan: Erectile Dysfunction: He has been using Sildenafil (Viagra) for erectile dysfunction and will continue to use Sildenafil as needed.  Orders: -     Sildenafil Citrate; Take 1 tablet (100 mg total) by mouth as needed for erectile dysfunction.  Dispense: 10 tablet; Refill: 11  Diabetes mellitus due to underlying condition, uncontrolled, with hyperglycemia (HCC) Assessment & Plan: Type 2 Diabetes Mellitus: Uncontrolled with a history of kidney damage, he has been inconsistently using Lantus insulin due to financial difficulties and has a family history of diabetes with complications. We will order a full diabetes  lab panel to assess current control and kidney function, start Toujeo insulin pen 24 units daily as a basal dose to replace Lantus, and initiate Fiasp insulin pen 5 units TID before meals as a bolus dose. Regular blood glucose monitoring will be ensured with a new glucose monitor, and he will be referred to an ophthalmologist for a diabetic eye exam. After reviewing lab results, we will consider adding a cholesterol medication and aspirin for cardiovascular protection.  Not sure if type 2, will get insulin and C-peptide to confirm, and start with just insulin to be safe. Offered continuous glucose monitor but he  doesn't like them..  Orders: -     CBC with Differential/Platelet -     Comprehensive metabolic panel -     Lipid panel -     Hemoglobin A1c -     Microalbumin / creatinine urine ratio -     Insulin and C-Peptide -     Blood Glucose Monitoring Suppl; 1 each by Does not apply route in the morning, at noon, and at bedtime. May substitute to any manufacturer covered by patient's insurance.  Dispense: 1 each; Refill: 0 -     Blood Glucose Test; 1 each by In Vitro route in the morning, at noon, and at bedtime. May substitute to any manufacturer covered by patient's insurance.  Dispense: 100 strip; Refill: 0 -     Lancet Device; 1 each by Does not apply route in the morning, at noon, and at bedtime. May substitute to any manufacturer covered by patient's insurance.  Dispense: 1 each; Refill: 0 -     Lancets Misc.; 1 each by Does not apply route in the morning, at noon, and at bedtime. May substitute to any manufacturer covered by patient's insurance.  Dispense: 100 each; Refill: 0 -     Toujeo SoloStar; Inject 24 Units into the skin daily. Replaces lantus.  Dispense: 90 mL; Refill: 3 -     Insulin Aspart (w/Niacinamide); Inject 5 Units into the skin 3 (three) times daily before meals.  Dispense: 9 mL; Refill: 1 -     Ambulatory referral to Ophthalmology     General Health Maintenance: A Cologuard test will be ordered for colon cancer screening, and a follow-up appointment will be scheduled in 1-2 weeks to review lab results and adjust the treatment plan as necessary.     Medical Decision Making: 1 or more chronic illnesses with exacerbation,  progression, or side effects of treatment 2 or more stable chronic illnesses     Ordering of each unique test; Prescription drug management           Clinical Presentation:    Patient affirms intent to establish a primary care provider relationship with Lula Olszewski, MD going forward.  57 y.o. male  with main concerns (chief complaints) today  expressed as New Patient (Initial Visit)   Discussed the use of AI scribe software for clinical note transcription with the patient, who gave verbal consent to proceed.  History of Present Illness   The patient, with a known history of type 2 diabetes, presents with concerns about his health, particularly in light of his family history. He has witnessed the impact of diabetes on his family, with his father having lived with the condition until his death at 28, and his sister currently in the end stages of kidney failure due to diabetes. The patient has been managing his diabetes with Lantus insulin, administered via a pen at a dose of  20 units daily. He has been obtaining his insulin from his sister due to financial difficulties and lack of insurance coverage.  The patient has recently obtained insurance through his current job and is seeking to improve his diabetes management. He has expressed a dislike for metformin, which he was previously prescribed, and is open to alternative treatments. He has not been monitoring his blood glucose levels regularly and has lost his glucose monitoring kit.  The patient has also been taking Sildenafil, but it is unclear whether this is for a specific condition. He has not been experiencing any chest discomfort, but he is aware of the cardiovascular risks associated with diabetes due to his mother's history of heart disease.  The patient has not been engaging in regular exercise, but he has been considering returning to the gym. He has been finding comfort and emotional support from his pet dog. Despite the challenges he is facing, the patient is determined to improve his health and manage his diabetes effectively.        Comprehensive chart development today: Problems:has At high risk for cardiovascular disease; Erectile dysfunction; and Diabetes mellitus due to underlying condition, uncontrolled, with hyperglycemia (HCC) on their problem list. Medications:  taking 20 units lantus only, which he borrows from family. Has been out of all medications due to lack of insurance. Allergies:  reports current alcohol use of about 2.0 standard drinks of alcohol per week. Past Medical History  has a past medical history of Allergy and Diabetes mellitus without complication (HCC). Past Surgical History  has no past surgical history on file. Social History  reports that he has never smoked. He has never used smokeless tobacco. He reports current alcohol use of about 2.0 standard drinks of alcohol per week. He reports that he does not use drugs. Family History family history includes Cancer in his sister; Diabetes in his brother, father, mother, and sister.  Depression Screen and Health Maintenance:    06/21/2023   11:00 AM  PHQ 2/9 Scores  PHQ - 2 Score 0   Health Maintenance  Topic Date Due   FOOT EXAM  Never done   OPHTHALMOLOGY EXAM  Never done   Diabetic kidney evaluation - Urine ACR  Never done   Colonoscopy  Never done   HEMOGLOBIN A1C  01/10/2017   Diabetic kidney evaluation - eGFR measurement  09/24/2021   Hepatitis C Screening  06/20/2024 (Originally 01[birth year]18/1985)   HIV Screening  06/20/2024 (Originally 01/18/1982)   Zoster Vaccines- Shingrix (1 of 2) 06/20/2024 (Originally 01/18/2017)   INFLUENZA VACCINE  07/20/2023   HPV VACCINES  Aged Out   DTaP/Tdap/Td  Discontinued   COVID-19 Vaccine  Discontinued    There is no immunization history on file for this patient.      Objective:  Physical Exam  BP 102/84 (BP Location: Left Arm, Patient Position: Sitting)   Pulse 97   Temp 98.3 F (36.8 C) (Temporal)   Ht 6\' 4"  (1.93 m)   Wt 190 lb 9.6 oz (86.5 kg)   SpO2 97%   BMI 23.20 kg/m  Wt Readings from Last 10 Encounters:  06/21/23 190 lb 9.6 oz (86.5 kg)  07/10/16 232 lb 12.9 oz (105.6 kg)   Vital signs reviewed.  Nursing notes reviewed. Weight trend reviewed. Abnormalities and problem-specific physical exam findings:   General Appearance:   Well developed, well nourished, well-groomed, healthy-appearing male with Body mass index is 23.2 kg/m. No acute distress appreciable.   Skin: Clear and well-hydrated. Pulmonary:  Normal  work of breathing at rest, no respiratory distress apparent. SpO2: 97 %  Musculoskeletal: He demonstrates smooth and coordinated movements throughout all major joints.All extremities are intact.  Neurological:  Awake, alert, oriented, and engaged.  No obvious focal neurological deficits or cognitive impairments.  Sensorium seems unclouded.  Psychiatric:  Appropriate mood, pleasant and cooperative demeanor, cheerful and engaged during the exam  Reviewed Results    Results for orders placed or performed during the hospital encounter of 09/24/20  Respiratory Panel by RT PCR (Flu A&B, Covid) - Nasopharyngeal Swab   Specimen: Nasopharyngeal Swab  Result Value Ref Range   SARS Coronavirus 2 by RT PCR NEGATIVE NEGATIVE   Influenza A by PCR NEGATIVE NEGATIVE   Influenza B by PCR NEGATIVE NEGATIVE  Comprehensive metabolic panel  Result Value Ref Range   Sodium 127 (L) 135 - 145 mmol/L   Potassium 5.6 (H) 3.5 - 5.1 mmol/L   Chloride 94 (L) 98 - 111 mmol/L   CO2 25 22 - 32 mmol/L   Glucose, Bld 823 (HH) 70 - 99 mg/dL   BUN 16 6 - 20 mg/dL   Creatinine, Ser 1.61 (H) 0.61 - 1.24 mg/dL   Calcium 9.3 8.9 - 09.6 mg/dL   Total Protein 7.7 6.5 - 8.1 g/dL   Albumin 4.4 3.5 - 5.0 g/dL   AST 34 15 - 41 U/L   ALT 41 0 - 44 U/L   Alkaline Phosphatase 94 38 - 126 U/L   Total Bilirubin 0.8 0.3 - 1.2 mg/dL   GFR calc non Af Amer 54 (L) >60 mL/min   Anion gap 8 5 - 15  CBC with Differential  Result Value Ref Range   WBC 7.1 4.0 - 10.5 K/uL   RBC 4.89 4.22 - 5.81 MIL/uL   Hemoglobin 14.3 13.0 - 17.0 g/dL   HCT 04.5 40.9 - 81.1 %   MCV 88.8 80.0 - 100.0 fL   MCH 29.2 26.0 - 34.0 pg   MCHC 32.9 30.0 - 36.0 g/dL   RDW 91.4 78.2 - 95.6 %   Platelets 324 150 - 400 K/uL   nRBC 0.0 0.0 - 0.2 %   Neutrophils Relative %  58 %   Neutro Abs 4.0 1.7 - 7.7 K/uL   Lymphocytes Relative 33 %   Lymphs Abs 2.4 0.7 - 4.0 K/uL   Monocytes Relative 7 %   Monocytes Absolute 0.5 0.1 - 1.0 K/uL   Eosinophils Relative 1 %   Eosinophils Absolute 0.1 0.0 - 0.5 K/uL   Basophils Relative 1 %   Basophils Absolute 0.1 0.0 - 0.1 K/uL   Immature Granulocytes 0 %   Abs Immature Granulocytes 0.02 0.00 - 0.07 K/uL  Urinalysis, Routine w reflex microscopic Urine, Clean Catch  Result Value Ref Range   Color, Urine COLORLESS (A) YELLOW   APPearance CLEAR CLEAR   Specific Gravity, Urine 1.026 1.005 - 1.030   pH 7.0 5.0 - 8.0   Glucose, UA >=500 (A) NEGATIVE mg/dL   Hgb urine dipstick NEGATIVE NEGATIVE   Bilirubin Urine NEGATIVE NEGATIVE   Ketones, ur 5 (A) NEGATIVE mg/dL   Protein, ur NEGATIVE NEGATIVE mg/dL   Nitrite NEGATIVE NEGATIVE   Leukocytes,Ua NEGATIVE NEGATIVE   Bacteria, UA NONE SEEN NONE SEEN  Blood gas, venous (at [facility] and [facility], not at [facility])  Result Value Ref Range   pH, Ven 7.323 7.250 - 7.430   pCO2, Ven 43.5 (L) 44.0 - 60.0 mmHg   pO2, Ven 31.8 (LL) 32.0 - 45.0  mmHg   Bicarbonate 21.9 20.0 - 28.0 mmol/L   Acid-base deficit 3.5 (H) 0.0 - 2.0 mmol/L   O2 Saturation 45.0 %   Patient temperature 98.6   POC CBG, ED  Result Value Ref Range   Glucose-Capillary >600 (HH) 70 - 99 mg/dL  POC CBG, ED  Result Value Ref Range   Glucose-Capillary >600 (HH) 70 - 99 mg/dL  POC CBG, ED  Result Value Ref Range   Glucose-Capillary 467 (H) 70 - 99 mg/dL    No results found for any visits on 06/21/23.  No results found for this or any previous visit (from the past 2160 hour(s)).  No image results found.   No results found.     Additional notes: Initial Appointment Goals:  This initial visit focused on establishing a foundation for the patient's care. We collaboratively reviewed his medical history and medications in detail, updating the chart as shown in the encounter. Given the extensive information, we prioritized  addressing his most pressing concerns, which he reported were: New Patient (Initial Visit)  While the complexity of the patient's medical picture may necessitate further evaluation in subsequent visits, we were able to develop a preliminary care plan together. To expedite a comprehensive plan at the next visit, we encouraged the patient to gather relevant medical records from previous providers. This collaborative approach will ensure a more complete understanding of the patient's health and inform the development of a personalized care plan. We look forward to continuing the conversation and working together with the patient on achieving his health goals.   Collaborative Documentation:  Today's encounter utilized real-time, dynamic patient engagement.  Patients actively participate by directly reviewing and assisting in updating their medical records through a shared screen. This transparency empowers patients to visually confirm chart updates made by the healthcare provider.  This collaborative approach facilitates problem management as we jointly update the problem list, problem overview, and assessment/plan. Ultimately, this process enhances chart accuracy and completeness, fostering shared decision-making, patient education, and informed consent for tests and treatments.  Collaborative Treatment Planning:  Treatment plans were discussed and reviewed in detail.  Explained medication safety and potential side effects.  Encouraged participation and answered all patient questions, confirming understanding and comfort with the plan. Encouraged patient to contact our office if they have any questions or concerns. Agreed on patient returning to office if symptoms worsen, persist, or new symptoms develop. Discussed precautions in case of needing to visit the Emergency Department.  ----------------------------------------------------- Lula Olszewski, MD  06/21/2023 1:08 PM  [facility]:  [phone]

## 2023-06-22 LAB — INSULIN AND C-PEPTIDE, SERUM
C-Peptide: 4 ng/mL (ref 1.1–4.4)
INSULIN: 17.5 u[IU]/mL (ref 2.6–24.9)

## 2023-06-22 NOTE — Progress Notes (Signed)
Please follow up, if not already reviewed by patient.

## 2023-06-23 NOTE — Telephone Encounter (Signed)
Patient states the following RX's are too expensive:  insulin aspart (FIASP) 100 UNIT/ML FlexTouch Pen  ($87)  insulin glargine, 1 Unit Dial, (TOUJEO SOLOSTAR) 300 UNIT/ML Solostar Pen ($87.00)  Accuchek Guide Test Strips are $35.00  Patient requests the above RX's be sent to:  [facility] [[id number]]   (Added to Chart in Preferred Pharmacy)

## 2023-06-26 ENCOUNTER — Other Ambulatory Visit (HOSPITAL_BASED_OUTPATIENT_CLINIC_OR_DEPARTMENT_OTHER): Payer: Self-pay

## 2023-06-26 ENCOUNTER — Other Ambulatory Visit: Payer: Self-pay

## 2023-06-26 DIAGNOSIS — E119 Type 2 diabetes mellitus without complications: Secondary | ICD-10-CM

## 2023-06-26 DIAGNOSIS — E0865 Diabetes mellitus due to underlying condition with hyperglycemia: Secondary | ICD-10-CM

## 2023-06-26 MED ORDER — TOUJEO SOLOSTAR 300 UNIT/ML ~~LOC~~ SOPN
24.0000 [IU] | PEN_INJECTOR | Freq: Every day | SUBCUTANEOUS | 3 refills | Status: AC
  Filled 2023-06-26: qty 4.5, 56d supply, fill #0

## 2023-06-26 MED ORDER — INSULIN ASPART (W/NIACINAMIDE) 100 UNIT/ML ~~LOC~~ SOPN
5.0000 [IU] | PEN_INJECTOR | Freq: Three times a day (TID) | SUBCUTANEOUS | 1 refills | Status: AC
  Filled 2023-06-26: qty 6, 40d supply, fill #0

## 2023-06-26 MED ORDER — BLOOD GLUCOSE TEST VI STRP
1.0000 | ORAL_STRIP | Freq: Three times a day (TID) | 0 refills | Status: AC
  Filled 2023-06-26: qty 100, 34d supply, fill #0

## 2023-06-26 NOTE — Telephone Encounter (Signed)
Switched these medications to requested pharmacy.

## 2023-06-29 ENCOUNTER — Telehealth: Payer: Self-pay | Admitting: Internal Medicine

## 2023-06-29 NOTE — Telephone Encounter (Signed)
Patient returned call. Requests to be called. 

## 2023-06-30 ENCOUNTER — Other Ambulatory Visit: Payer: Self-pay

## 2023-06-30 DIAGNOSIS — E119 Type 2 diabetes mellitus without complications: Secondary | ICD-10-CM

## 2023-06-30 DIAGNOSIS — E0865 Diabetes mellitus due to underlying condition with hyperglycemia: Secondary | ICD-10-CM

## 2023-06-30 NOTE — Telephone Encounter (Signed)
Spoke with patient today. 

## 2023-07-03 ENCOUNTER — Other Ambulatory Visit (HOSPITAL_BASED_OUTPATIENT_CLINIC_OR_DEPARTMENT_OTHER): Payer: Self-pay

## 2023-07-21 ENCOUNTER — Ambulatory Visit: Payer: Commercial Managed Care - PPO | Admitting: Internal Medicine

## 2023-09-01 ENCOUNTER — Ambulatory Visit (HOSPITAL_BASED_OUTPATIENT_CLINIC_OR_DEPARTMENT_OTHER): Payer: Commercial Managed Care - PPO | Admitting: Cardiology

## 2023-12-08 ENCOUNTER — Ambulatory Visit (HOSPITAL_BASED_OUTPATIENT_CLINIC_OR_DEPARTMENT_OTHER): Payer: Commercial Managed Care - PPO | Admitting: Cardiology

## 2024-09-06 ENCOUNTER — Other Ambulatory Visit: Payer: Self-pay

## 2024-09-06 ENCOUNTER — Encounter (HOSPITAL_COMMUNITY): Payer: Self-pay

## 2024-09-06 ENCOUNTER — Emergency Department (HOSPITAL_COMMUNITY)
Admission: EM | Admit: 2024-09-06 | Discharge: 2024-09-06 | Disposition: A | Discharge status: Home / Self Care | Attending: Emergency Medicine | Admitting: Emergency Medicine

## 2024-09-06 DIAGNOSIS — R739 Hyperglycemia, unspecified: Secondary | ICD-10-CM | POA: Diagnosis present

## 2024-09-06 DIAGNOSIS — E1165 Type 2 diabetes mellitus with hyperglycemia: Secondary | ICD-10-CM | POA: Diagnosis not present

## 2024-09-06 DIAGNOSIS — Z7982 Long term (current) use of aspirin: Secondary | ICD-10-CM | POA: Insufficient documentation

## 2024-09-06 DIAGNOSIS — Z794 Long term (current) use of insulin: Secondary | ICD-10-CM | POA: Diagnosis not present

## 2024-09-06 LAB — I-STAT CHEM 8, ED
BUN: 14 mg/dL (ref 6–20)
Calcium, Ion: 1.2 mmol/L (ref 1.15–1.40)
Chloride: 98 mmol/L (ref 98–111)
Creatinine, Ser: 1 mg/dL (ref 0.61–1.24)
Glucose, Bld: 617 mg/dL (ref 70–99)
HCT: 41 % (ref 39.0–52.0)
Hemoglobin: 13.9 g/dL (ref 13.0–17.0)
Potassium: 4.6 mmol/L (ref 3.5–5.1)
Sodium: 132 mmol/L — ABNORMAL LOW (ref 135–145)
TCO2: 23 mmol/L (ref 22–32)

## 2024-09-06 LAB — COMPREHENSIVE METABOLIC PANEL WITH GFR
ALT: 27 U/L (ref 0–44)
AST: 24 U/L (ref 15–41)
Albumin: 4.5 g/dL (ref 3.5–5.0)
Alkaline Phosphatase: 101 U/L (ref 38–126)
Anion gap: 16 — ABNORMAL HIGH (ref 5–15)
BUN: 14 mg/dL (ref 6–20)
CO2: 21 mmol/L — ABNORMAL LOW (ref 22–32)
Calcium: 9.7 mg/dL (ref 8.9–10.3)
Chloride: 94 mmol/L — ABNORMAL LOW (ref 98–111)
Creatinine, Ser: 1.13 mg/dL (ref 0.61–1.24)
GFR, Estimated: >60 mL/min (ref >60)
Glucose, Bld: 626 mg/dL (ref 70–99)
Potassium: 4.8 mmol/L (ref 3.5–5.1)
Sodium: 131 mmol/L — ABNORMAL LOW (ref 135–145)
Total Bilirubin: 0.4 mg/dL (ref 0.0–1.2)
Total Protein: 7.4 g/dL (ref 6.5–8.1)

## 2024-09-06 LAB — CBC
HCT: 41.6 % (ref 39.0–52.0)
Hemoglobin: 13.4 g/dL (ref 13.0–17.0)
MCH: 27.9 pg (ref 26.0–34.0)
MCHC: 32.2 g/dL (ref 30.0–36.0)
MCV: 86.5 fL (ref 80.0–100.0)
Platelets: 343 K/uL (ref 150–400)
RBC: 4.81 MIL/uL (ref 4.22–5.81)
RDW: 12.1 % (ref 11.5–15.5)
WBC: 8.3 K/uL (ref 4.0–10.5)
nRBC: 0 % (ref 0.0–0.2)

## 2024-09-06 LAB — URINALYSIS, ROUTINE W REFLEX MICROSCOPIC
Bacteria, UA: NONE SEEN
Bilirubin Urine: NEGATIVE
Glucose, UA: >=500 mg/dL — AB
Hgb urine dipstick: NEGATIVE
Ketones, ur: NEGATIVE mg/dL
Leukocytes,Ua: NEGATIVE
Nitrite: NEGATIVE
Protein, ur: NEGATIVE mg/dL
Specific Gravity, Urine: 1.031 — ABNORMAL HIGH (ref 1.005–1.030)
pH: 5 (ref 5.0–8.0)

## 2024-09-06 LAB — BLOOD GAS, VENOUS
Acid-base deficit: 0.8 mmol/L (ref 0.0–2.0)
Bicarbonate: 24.9 mmol/L (ref 20.0–28.0)
O2 Saturation: 76.2 %
Patient temperature: 37
pCO2, Ven: 44 mmHg (ref 44–60)
pH, Ven: 7.36 (ref 7.25–7.43)
pO2, Ven: 43 mmHg (ref 32–45)

## 2024-09-06 LAB — CBG MONITORING, ED
Glucose-Capillary: 569 mg/dL (ref 70–99)
Glucose-Capillary: 292 mg/dL — ABNORMAL HIGH (ref 70–99)
Glucose-Capillary: 247 mg/dL — ABNORMAL HIGH (ref 70–99)

## 2024-09-06 LAB — BETA-HYDROXYBUTYRIC ACID: Beta-Hydroxybutyric Acid: 0.12 mmol/L (ref 0.05–0.27)

## 2024-09-06 MED ORDER — BLOOD GLUCOSE TEST STRIPS 333 VI STRP: ORAL_STRIP | 1 refills | Status: AC

## 2024-09-06 MED ORDER — INSULIN ASPART 100 UNIT/ML IJ SOLN
12.0000 [IU] | Freq: Once | INTRAMUSCULAR | Status: AC
Start: 2024-09-06 — End: 2024-09-06
  Administered 2024-09-06: 12 [IU] via SUBCUTANEOUS
  Filled 2024-09-06: qty 0.12

## 2024-09-06 MED ORDER — SODIUM CHLORIDE 0.9 % IV BOLUS
1000.0000 mL | Freq: Once | INTRAVENOUS | Status: AC
  Administered 2024-09-06: 1000 mL via INTRAVENOUS

## 2024-09-06 MED ORDER — SODIUM CHLORIDE 0.9 % IV BOLUS
1000.0000 mL | Freq: Once | INTRAVENOUS | Status: AC
Start: 2024-09-06 — End: 2024-09-06
  Administered 2024-09-06: 1000 mL via INTRAVENOUS

## 2024-09-06 NOTE — ED Triage Notes (Signed)
 PT States he has numbness in both feet and blurred vision x 1 week PT has history of type 2 diabetes and hasn't been checking is blood glucose levels because he is out of his strips. CBG in triage was 569

## 2024-09-06 NOTE — ED Provider Notes (Signed)
 [place] EMERGENCY DEPARTMENT AT [facility] Provider Note   CSN: [id number] Arrival date & time: 09/06/24  0206     Patient presents with: Hyperglycemia, Numbness, and Visual Field Change   Jacob Smith. is a 58 y.o. male poorly controlled type II diabetic who is poorly compliant with his insulin  presents today with progressive numbness and tingling in his hands and his feet over the last week and intermittent blurry vision, urinating much more frequently than normal.  States he has been checking his blood sugar because he is out of the CBG strips, but states that at baseline he only takes his insulin  approximately 2 days/week because he is hardheaded.  No nausea vomiting abdominal pain syncope, fevers or chills.   HPI     Prior to Admission medications   Medication Sig Start Date End Date Taking? Authorizing Provider  Glucose Blood (BLOOD GLUCOSE TEST STRIPS 333) STRP Check your blood sugar three times daily before meals 09/06/24  Yes Ansley Stanwood R, PA-C  aspirin  EC 81 MG tablet Take 1 tablet (81 mg total) by mouth daily. Swallow whole. 06/21/23   Jesus Bernardino MATSU, MD  Blood Glucose Monitoring Suppl DEVI 1 each by Does not apply route in the morning, at noon, and at bedtime. May substitute to any manufacturer covered by patient's insurance. 06/21/23   Jesus Bernardino MATSU, MD  insulin  aspart (FIASP ) 100 UNIT/ML FlexTouch Pen Inject 5 Units into the skin 3 (three) times daily before meals. 06/26/23   Jesus Bernardino MATSU, MD  insulin  glargine, 1 Unit Dial , (TOUJEO  SOLOSTAR) 300 UNIT/ML Solostar Pen Inject 24 Units into the skin daily. Replaces lantus . 06/26/23 09/24/23  Jesus Bernardino MATSU, MD  Insulin  Pen Needle (UNIFINE PENTIPS) 30G X 5 MM MISC 1 Units by miscellaneous route Once Daily. 05/13/21   [provider]  rosuvastatin  (CRESTOR ) 40 MG tablet Take 1 tablet (40 mg total) by mouth daily. 06/21/23   Jesus Bernardino MATSU, MD  sildenafil  (VIAGRA ) 100 MG tablet Take 1 tablet  (100 mg total) by mouth as needed for erectile dysfunction. 06/21/23   Jesus Bernardino MATSU, MD    Allergies: Patient has no known allergies.    Review of Systems  Eyes:  Positive for visual disturbance.  Genitourinary:  Positive for frequency.    Updated Vital Signs BP 122/79   Pulse 89   Temp 97.7 F (36.5 C) (Oral)   Resp 16   Wt 85.3 kg   SpO2 97%   BMI 22.88 kg/m   Physical Exam Vitals and nursing note reviewed.  Constitutional:      Appearance: He is not ill-appearing or toxic-appearing.  HENT:     Head: Normocephalic and atraumatic.     Nose: Nose normal.     Mouth/Throat:     Mouth: Mucous membranes are moist.     Pharynx: No oropharyngeal exudate or posterior oropharyngeal erythema.  Eyes:     General: Lids are normal. Vision grossly intact.        Right eye: No discharge.        Left eye: No discharge.     Extraocular Movements: Extraocular movements intact.     Conjunctiva/sclera: Conjunctivae normal.     Pupils: Pupils are equal, round, and reactive to light.  Cardiovascular:     Rate and Rhythm: Normal rate and regular rhythm.     Pulses: Normal pulses.  Pulmonary:     Effort: Pulmonary effort is normal. No respiratory distress.     Breath sounds:  Normal breath sounds. No wheezing or rales.  Abdominal:     General: Bowel sounds are normal. There is no distension.     Palpations: Abdomen is soft.     Tenderness: There is no abdominal tenderness. There is no guarding or rebound.  Musculoskeletal:        General: No deformity.     Cervical back: Neck supple.     Right lower leg: No edema.     Left lower leg: No edema.  Skin:    General: Skin is warm and dry.     Capillary Refill: Capillary refill takes less than 2 seconds.  Neurological:     General: No focal deficit present.     Mental Status: He is alert and oriented to person, place, and time. Mental status is at baseline.     GCS: GCS eye subscore is 4. GCS verbal subscore is 5. GCS motor subscore is  6.     Gait: Gait is intact.  Psychiatric:        Mood and Affect: Mood normal.     (all labs ordered are listed, but only abnormal results are displayed) Labs Reviewed  URINALYSIS, ROUTINE W REFLEX MICROSCOPIC - Abnormal; Notable for the following components:      Result Value   Color, Urine STRAW (*)    Specific Gravity, Urine 1.031 (*)    Glucose, UA >=500 (*)    All other components within normal limits  COMPREHENSIVE METABOLIC PANEL WITH GFR - Abnormal; Notable for the following components:   Sodium 131 (*)    Chloride 94 (*)    CO2 21 (*)    Glucose, Bld 626 (*)    Anion gap 16 (*)    All other components within normal limits  CBG MONITORING, ED - Abnormal; Notable for the following components:   Glucose-Capillary 569 (*)    All other components within normal limits  CBG MONITORING, ED - Abnormal; Notable for the following components:   Glucose-Capillary 292 (*)    All other components within normal limits  I-STAT CHEM 8, ED - Abnormal; Notable for the following components:   Sodium 132 (*)    Glucose, Bld 617 (*)    All other components within normal limits  CBG MONITORING, ED - Abnormal; Notable for the following components:   Glucose-Capillary 247 (*)    All other components within normal limits  CBC  BLOOD GAS, VENOUS  BETA-HYDROXYBUTYRIC ACID  CBG MONITORING, ED  CBG MONITORING, ED    EKG: EKG Interpretation Date/Time:  Friday September 06 2024 03:03:29 EDT Ventricular Rate:  82 PR Interval:  175 QRS Duration:  82 QT Interval:  361 QTC Calculation: 422 R Axis:   59  Text Interpretation: Sinus rhythm Normal ECG When compared with ECG of 09/24/2020, No significant change was found Confirmed by Raford Lenis ([id number]) on 09/06/2024 3:11:24 AM  Radiology: No results found.   Procedures   Medications Ordered in the ED  sodium chloride  0.9 % bolus 1,000 mL (0 mLs Intravenous Stopped 09/06/24 0458)  insulin  aspart (novoLOG ) injection 12 Units (12 Units  Subcutaneous Given 09/06/24 0338)  sodium chloride  0.9 % bolus 1,000 mL (0 mLs Intravenous Stopped 09/06/24 9376)                                    Medical Decision Making 58 year old male with history of type 2 diabetes who presents with concern  for elevated blood sugars, vision changes, and neuropathy symptoms.  Vitals are reassuring on intake.  Cardiopulmonary and abdominal exams are benign.  Patient well-appearing with GCS of 15.  CBC unremarkable, CMP with pseudohyponatremia with hyperglycemia of 626, potassium 4.8.  Mildly elevated anion gap to 16.  Normal renal and hepatic function.  VBG is reassuring with pH of 7.36.  Beta hydroxy normal 0.12.  EKG revealed normal sinus rhythm without ischemic changes.  Patient administered 2L of fluids and insulin .   Amount and/or Complexity of Data Reviewed Labs: ordered.  Risk OTC drugs. Prescription drug management.   Improvement in patient's symptomatology as well as CBG down to the 200s after fluid boluses and insulin .  No evidence of hyperglycemic crisis this evening, GCS of 15.  Patient hemodynamically stable.  Clinical concern for emergent underlying condition that would warrant further ED workup or inpatient management is exceedingly low.  Importance of adherence to his insulin  regimen was discussed at length with the patient who voiced understanding.  Recommend close both patient follow-up with his PCP.  Eliazer voiced understanding of his medical evaluation and treatment plan. Each of their questions answered to their expressed satisfaction.  Return precautions were given.  Patient is well-appearing, stable, and was discharged in good condition.  This chart was dictated using voice recognition software, Dragon. Despite the best efforts of this provider to proofread and correct errors, errors may still occur which can change documentation meaning.     Final diagnoses:  Hyperglycemia    ED Discharge Orders          Ordered     Glucose Blood (BLOOD GLUCOSE TEST STRIPS 333) STRP        09/06/24 0612               Agastya Meister, Pleasant SAUNDERS, PA-C 09/06/24 0656    Raford Lenis, MD 09/06/24 RUTHE    Raford Lenis, MD 09/06/24 2251

## 2024-09-06 NOTE — Discharge Instructions (Signed)
 You are seen in the ER today for your elevated blood sugars.  Fortunately you were not in any hyperglycemic crisis.  Your sugar was treated in the emergency department and it was returning back towards normal level.  It is imperative that you resume taking your insulin  daily as prescribed, as well as checking your blood glucose as recommended by your primary care doctor.  Continue to follow-up with your PCP and return to the ER with any severe symptoms.

## 2024-11-20 ENCOUNTER — Other Ambulatory Visit: Payer: Self-pay

## 2024-11-20 ENCOUNTER — Emergency Department (HOSPITAL_COMMUNITY)
Admission: EM | Admit: 2024-11-20 | Discharge: 2024-11-20 | Disposition: A | Discharge status: Home / Self Care | Attending: Emergency Medicine | Admitting: Emergency Medicine

## 2024-11-20 ENCOUNTER — Emergency Department (HOSPITAL_COMMUNITY)

## 2024-11-20 ENCOUNTER — Encounter (HOSPITAL_COMMUNITY): Payer: Self-pay

## 2024-11-20 DIAGNOSIS — R590 Localized enlarged lymph nodes: Secondary | ICD-10-CM | POA: Diagnosis not present

## 2024-11-20 DIAGNOSIS — E119 Type 2 diabetes mellitus without complications: Secondary | ICD-10-CM | POA: Insufficient documentation

## 2024-11-20 DIAGNOSIS — Z7982 Long term (current) use of aspirin: Secondary | ICD-10-CM | POA: Insufficient documentation

## 2024-11-20 DIAGNOSIS — R509 Fever, unspecified: Secondary | ICD-10-CM | POA: Diagnosis present

## 2024-11-20 DIAGNOSIS — Z794 Long term (current) use of insulin: Secondary | ICD-10-CM | POA: Insufficient documentation

## 2024-11-20 DIAGNOSIS — J069 Acute upper respiratory infection, unspecified: Secondary | ICD-10-CM | POA: Insufficient documentation

## 2024-11-20 LAB — RESP PANEL BY RT-PCR (RSV, FLU A&B, COVID)  RVPGX2
Influenza A by PCR: NEGATIVE
Influenza B by PCR: NEGATIVE
Resp Syncytial Virus by PCR: NEGATIVE
SARS Coronavirus 2 by RT PCR: NEGATIVE

## 2024-11-20 NOTE — ED Triage Notes (Signed)
 PT c/o flu-like symptoms since Saturday.  States coworker was sick.  Pt c/o productive cough, chills and feeling warm, did not take actual temperature at home.  Denies n/v/d.  PT drove self to ER   PT states he attempted Thera-Flu and Mucinex with no major relief.

## 2024-11-20 NOTE — ED Provider Notes (Signed)
 [place] EMERGENCY DEPARTMENT AT [facility] Provider Note   CSN: [id number] Arrival date & time: 11/20/24  9757     Patient presents with: URI (PT c/o flu-like symptoms since Saturday.  States coworker was sick.  Pt c/o productive cough, chills and feeling warm, did not take actual temperature at home.  Denies n/v/d.  PT drove self to ER )   Jacob Smith. is a 58 y.o. male with history of diabetes on insulin  presents with complaints of subjective fevers, chills, myalgias and nonproductive cough over the past few days.  No significant relief with over-the-counter medications.  Recent sick contact with his coworker.  No abdominal pain or vomiting.  No chest pain or shortness of breath.    URI Presenting symptoms: cough       Past Medical History:  Diagnosis Date   Allergy    Diabetes mellitus without complication (HCC)    ED (erectile dysfunction)    History reviewed. No pertinent surgical history.   Prior to Admission medications   Medication Sig Start Date End Date Taking? Authorizing Provider  aspirin  EC 81 MG tablet Take 1 tablet (81 mg total) by mouth daily. Swallow whole. 06/21/23   Jesus Bernardino MATSU, MD  Blood Glucose Monitoring Suppl DEVI 1 each by Does not apply route in the morning, at noon, and at bedtime. May substitute to any manufacturer covered by patient's insurance. 06/21/23   Jesus Bernardino MATSU, MD  Glucose Blood (BLOOD GLUCOSE TEST STRIPS 333) STRP Check your blood sugar three times daily before meals 09/06/24   Sponseller, Rebekah R, PA-C  insulin  aspart (FIASP ) 100 UNIT/ML FlexTouch Pen Inject 5 Units into the skin 3 (three) times daily before meals. 06/26/23   Jesus Bernardino MATSU, MD  insulin  glargine, 1 Unit Dial , (TOUJEO  SOLOSTAR) 300 UNIT/ML Solostar Pen Inject 24 Units into the skin daily. Replaces lantus . 06/26/23 09/24/23  Jesus Bernardino MATSU, MD  Insulin  Pen Needle (UNIFINE PENTIPS) 30G X 5 MM MISC 1 Units by miscellaneous route Once Daily. 05/13/21    [provider]  rosuvastatin  (CRESTOR ) 40 MG tablet Take 1 tablet (40 mg total) by mouth daily. 06/21/23   Jesus Bernardino MATSU, MD  sildenafil  (VIAGRA ) 100 MG tablet Take 1 tablet (100 mg total) by mouth as needed for erectile dysfunction. 06/21/23   Jesus Bernardino MATSU, MD    Allergies: Patient has no known allergies.    Review of Systems  Respiratory:  Positive for cough.     Updated Vital Signs BP 122/78 (BP Location: Left Arm)   Pulse 90   Temp 98.7 F (37.1 C) (Oral)   Resp 18   Ht 6' 4 (1.93 m)   Wt 89.8 kg   SpO2 99%   BMI 24.10 kg/m   Physical Exam Vitals and nursing note reviewed.  Constitutional:      General: He is not in acute distress.    Appearance: He is well-developed.  HENT:     Head: Normocephalic and atraumatic.     Mouth/Throat:     Pharynx: No oropharyngeal exudate or posterior oropharyngeal erythema.  Eyes:     Conjunctiva/sclera: Conjunctivae normal.  Cardiovascular:     Rate and Rhythm: Normal rate and regular rhythm.     Heart sounds: No murmur heard. Pulmonary:     Effort: Pulmonary effort is normal. No respiratory distress.     Breath sounds: Normal breath sounds.  Abdominal:     Palpations: Abdomen is soft.     Tenderness: There  is no abdominal tenderness.  Musculoskeletal:        General: No swelling.     Cervical back: Neck supple.  Lymphadenopathy:     Cervical: Cervical adenopathy present.  Skin:    General: Skin is warm and dry.     Capillary Refill: Capillary refill takes less than 2 seconds.  Neurological:     Mental Status: He is alert.  Psychiatric:        Mood and Affect: Mood normal.     (all labs ordered are listed, but only abnormal results are displayed) Labs Reviewed  RESP PANEL BY RT-PCR (RSV, FLU A&B, COVID)  [id number]    EKG: None  Radiology: DG Chest 2 View Result Date: 11/20/2024 EXAM: 2 VIEW(S) XRAY OF THE CHEST 11/20/2024 03:26:38 AM COMPARISON: None available. CLINICAL HISTORY: flu-like symptoms  FINDINGS: LUNGS AND PLEURA: No focal pulmonary opacity. No pleural effusion. No pneumothorax. HEART AND MEDIASTINUM: No acute abnormality of the cardiac and mediastinal silhouettes. BONES AND SOFT TISSUES: No acute osseous abnormality. IMPRESSION: 1. No acute cardiopulmonary process. Electronically signed by: Norman Gatlin MD 11/20/2024 03:29 AM EST RP Workstation: [id number]     Procedures   Medications Ordered in the ED - No data to display  Clinical Course as of 11/20/24 0553  Wed Nov 20, 2024  0405 Patient evaluated for URI symptoms over the past few days.  Upon arrival he is hemodynamically stable.  On exam his lungs are clear.  Abdomen is nontender.  He does have bilateral cervical lymphadenopathy without any tonsillar exudate.   [JT]  0552 Resp panel by RT-PCR (RSV, Flu A&B, Covid) Anterior Nasal Swab Negative [JT]  0552 DG Chest 2 View Negative [JT]  0552 Workup overall reassuring, etiology most likely viral URI.  Will be discharged home.  Recommended supportive care.  Strict return precautions provided.  Patient is understanding agreement plan. [JT]    Clinical Course User Index [JT] Donnajean Lynwood DEL, PA-C                                 Medical Decision Making Amount and/or Complexity of Data Reviewed Radiology: ordered.   This patient presents to the ED with chief complaint(s) of cough .  The complaint involves an extensive differential diagnosis and also carries with it a high risk of complications and morbidity.   Pertinent past medical history as listed in HPI  The differential diagnosis includes  Do not suspect pneumonia as patient is afebrile with clear lung sounds and no consolidation on x-ray.  Does not meet Centor criteria, do not suspect strep.  Additional history obtained: Records reviewed Care Everywhere/External Records  Disposition:   Patient will be discharged home. The patient has been appropriately medically screened and/or stabilized in the ED. I  have low suspicion for any other emergent medical condition which would require further screening, evaluation or treatment in the ED or require inpatient management. At time of discharge the patient is hemodynamically stable and in no acute distress. I have discussed work-up results and diagnosis with patient and answered all questions. Patient is agreeable with discharge plan. We discussed strict return precautions for returning to the emergency department and they verbalized understanding.     Social Determinants of Health:   none  This note was dictated with voice recognition software.  Despite best efforts at proofreading, errors may have occurred which can change the documentation meaning.  Final diagnoses:  Viral URI with cough    ED Discharge Orders     None          Donnajean Lynwood VEAR DEVONNA 11/20/24 9446    Palumbo, April, MD 11/20/24 9440

## 2024-11-20 NOTE — Discharge Instructions (Signed)
 You were evaluated in the emergency room for cough.  Your respiratory panel and chest x-ray were negative..You may use Tylenol 1000 mg and/or Motrin 600 mg every 4-6 hours up to 3 times a day for fever or body aches.  Please keep in mind that this dosing is not meant to be continued long-term and that many over-the-counter cough and flu medications contain acetaminophen or ibuprofen. You can expect your current symptoms to linger over the next week or two but please return to the emergency room if you experience any new or worsening symptoms including persistent fevers, worsening productive cough and persistent vomiting. Please follow-up with your primary care provider regarding your ER visit.
